# Patient Record
Sex: Female | Born: 1980 | Race: White | Hispanic: Yes | Marital: Single | Smoking: Never smoker
Health system: Southern US, Community
[De-identification: ages and names within clinical notes are randomized; demographics above are authoritative.]

## PROBLEM LIST (undated history)

## (undated) DIAGNOSIS — F419 Anxiety disorder, unspecified: Secondary | ICD-10-CM

---

## 2002-12-26 ENCOUNTER — Other Ambulatory Visit: Admission: RE | Admit: 2002-12-26 | Discharge: 2002-12-26 | Payer: Self-pay | Admitting: Obstetrics and Gynecology

## 2003-04-19 ENCOUNTER — Inpatient Hospital Stay (HOSPITAL_COMMUNITY): Admission: AD | Admit: 2003-04-19 | Discharge: 2003-04-19 | Payer: Self-pay | Admitting: Obstetrics and Gynecology

## 2003-04-20 ENCOUNTER — Inpatient Hospital Stay (HOSPITAL_COMMUNITY): Admission: AD | Admit: 2003-04-20 | Discharge: 2003-04-20 | Payer: Self-pay | Admitting: *Deleted

## 2003-04-26 ENCOUNTER — Inpatient Hospital Stay (HOSPITAL_COMMUNITY): Admission: AD | Admit: 2003-04-26 | Discharge: 2003-04-28 | Payer: Self-pay | Admitting: Obstetrics and Gynecology

## 2003-12-29 ENCOUNTER — Ambulatory Visit (HOSPITAL_COMMUNITY): Admission: RE | Admit: 2003-12-29 | Discharge: 2003-12-29 | Payer: Self-pay | Admitting: *Deleted

## 2004-01-25 ENCOUNTER — Inpatient Hospital Stay (HOSPITAL_COMMUNITY): Admission: AD | Admit: 2004-01-25 | Discharge: 2004-01-25 | Payer: Self-pay | Admitting: *Deleted

## 2004-01-27 ENCOUNTER — Inpatient Hospital Stay (HOSPITAL_COMMUNITY): Admission: AD | Admit: 2004-01-27 | Discharge: 2004-01-27 | Payer: Self-pay | Admitting: Obstetrics and Gynecology

## 2004-01-29 ENCOUNTER — Ambulatory Visit: Payer: Self-pay | Admitting: Family Medicine

## 2004-02-01 ENCOUNTER — Encounter: Admission: RE | Admit: 2004-02-01 | Discharge: 2004-02-01 | Payer: Self-pay | Admitting: Family Medicine

## 2004-02-01 ENCOUNTER — Inpatient Hospital Stay (HOSPITAL_COMMUNITY): Admission: AD | Admit: 2004-02-01 | Discharge: 2004-02-03 | Payer: Self-pay | Admitting: *Deleted

## 2004-02-08 ENCOUNTER — Encounter: Admission: RE | Admit: 2004-02-08 | Discharge: 2004-02-08 | Payer: Self-pay | Admitting: Family Medicine

## 2004-02-22 ENCOUNTER — Encounter: Admission: RE | Admit: 2004-02-22 | Discharge: 2004-02-22 | Payer: Self-pay | Admitting: Family Medicine

## 2004-02-29 ENCOUNTER — Ambulatory Visit: Payer: Self-pay | Admitting: Family Medicine

## 2004-03-02 ENCOUNTER — Ambulatory Visit: Payer: Self-pay | Admitting: Obstetrics and Gynecology

## 2004-03-02 ENCOUNTER — Inpatient Hospital Stay (HOSPITAL_COMMUNITY): Admission: AD | Admit: 2004-03-02 | Discharge: 2004-03-05 | Payer: Self-pay | Admitting: *Deleted

## 2004-03-06 ENCOUNTER — Encounter: Admission: RE | Admit: 2004-03-06 | Discharge: 2004-04-05 | Payer: Self-pay | Admitting: *Deleted

## 2004-11-24 ENCOUNTER — Emergency Department (HOSPITAL_COMMUNITY): Admission: EM | Admit: 2004-11-24 | Discharge: 2004-11-24 | Payer: Self-pay | Admitting: Emergency Medicine

## 2005-04-25 ENCOUNTER — Ambulatory Visit: Payer: Self-pay | Admitting: Family Medicine

## 2005-05-02 ENCOUNTER — Ambulatory Visit: Payer: Self-pay | Admitting: Family Medicine

## 2005-05-09 ENCOUNTER — Ambulatory Visit (HOSPITAL_COMMUNITY): Admission: RE | Admit: 2005-05-09 | Discharge: 2005-05-09 | Payer: Self-pay | Admitting: Family Medicine

## 2005-05-30 ENCOUNTER — Ambulatory Visit: Payer: Self-pay | Admitting: Family Medicine

## 2005-06-13 ENCOUNTER — Ambulatory Visit: Payer: Self-pay | Admitting: Family Medicine

## 2005-06-20 ENCOUNTER — Ambulatory Visit: Payer: Self-pay | Admitting: Sports Medicine

## 2005-06-27 ENCOUNTER — Ambulatory Visit (HOSPITAL_COMMUNITY): Admission: RE | Admit: 2005-06-27 | Discharge: 2005-06-27 | Payer: Self-pay | Admitting: Family Medicine

## 2005-07-24 ENCOUNTER — Inpatient Hospital Stay (HOSPITAL_COMMUNITY): Admission: AD | Admit: 2005-07-24 | Discharge: 2005-07-24 | Payer: Self-pay | Admitting: Obstetrics & Gynecology

## 2005-07-24 ENCOUNTER — Ambulatory Visit: Payer: Self-pay | Admitting: Family Medicine

## 2005-07-24 ENCOUNTER — Ambulatory Visit: Payer: Self-pay | Admitting: Certified Nurse Midwife

## 2005-08-01 ENCOUNTER — Ambulatory Visit: Payer: Self-pay | Admitting: Family Medicine

## 2005-08-06 ENCOUNTER — Inpatient Hospital Stay (HOSPITAL_COMMUNITY): Admission: AD | Admit: 2005-08-06 | Discharge: 2005-08-08 | Payer: Self-pay | Admitting: Obstetrics and Gynecology

## 2005-08-06 ENCOUNTER — Ambulatory Visit: Payer: Self-pay | Admitting: *Deleted

## 2005-08-15 ENCOUNTER — Ambulatory Visit: Payer: Self-pay | Admitting: *Deleted

## 2005-08-15 ENCOUNTER — Inpatient Hospital Stay (HOSPITAL_COMMUNITY): Admission: AD | Admit: 2005-08-15 | Discharge: 2005-08-16 | Payer: Self-pay | Admitting: *Deleted

## 2005-08-15 ENCOUNTER — Ambulatory Visit: Payer: Self-pay | Admitting: Sports Medicine

## 2005-08-27 ENCOUNTER — Ambulatory Visit: Payer: Self-pay | Admitting: Obstetrics and Gynecology

## 2005-08-27 ENCOUNTER — Inpatient Hospital Stay (HOSPITAL_COMMUNITY): Admission: AD | Admit: 2005-08-27 | Discharge: 2005-08-27 | Payer: Self-pay | Admitting: Obstetrics and Gynecology

## 2005-09-01 ENCOUNTER — Ambulatory Visit: Payer: Self-pay | Admitting: Sports Medicine

## 2005-09-17 ENCOUNTER — Ambulatory Visit: Payer: Self-pay | Admitting: Sports Medicine

## 2005-09-23 ENCOUNTER — Inpatient Hospital Stay (HOSPITAL_COMMUNITY): Admission: AD | Admit: 2005-09-23 | Discharge: 2005-09-24 | Payer: Self-pay | Admitting: *Deleted

## 2005-09-23 ENCOUNTER — Ambulatory Visit: Payer: Self-pay | Admitting: Family Medicine

## 2005-09-23 ENCOUNTER — Ambulatory Visit: Payer: Self-pay | Admitting: *Deleted

## 2005-11-07 ENCOUNTER — Ambulatory Visit: Payer: Self-pay | Admitting: Family Medicine

## 2005-12-01 ENCOUNTER — Ambulatory Visit: Payer: Self-pay | Admitting: Family Medicine

## 2008-11-24 ENCOUNTER — Encounter: Payer: Self-pay | Admitting: Family Medicine

## 2008-11-24 ENCOUNTER — Ambulatory Visit: Payer: Self-pay | Admitting: Family Medicine

## 2008-11-24 LAB — CONVERTED CEMR LAB
ABO/RH(D): O POS
Antibody Screen: NEGATIVE
Basophils Absolute: 0 10*3/uL (ref 0.0–0.1)
Basophils Relative: 0 % (ref 0–1)
Eosinophils Absolute: 0.1 10*3/uL (ref 0.0–0.7)
Eosinophils Relative: 2 % (ref 0–5)
HCT: 32.8 % — ABNORMAL LOW (ref 36.0–46.0)
Hemoglobin: 10.5 g/dL — ABNORMAL LOW (ref 12.0–15.0)
Hepatitis B Surface Ag: NEGATIVE
Lymphocytes Relative: 27 % (ref 12–46)
Lymphs Abs: 2 10*3/uL (ref 0.7–4.0)
MCHC: 32 g/dL (ref 30.0–36.0)
MCV: 82.8 fL (ref 78.0–100.0)
Monocytes Absolute: 0.6 10*3/uL (ref 0.1–1.0)
Monocytes Relative: 8 % (ref 3–12)
Neutro Abs: 4.8 10*3/uL (ref 1.7–7.7)
Neutrophils Relative %: 63 % (ref 43–77)
Platelets: 254 10*3/uL (ref 150–400)
RBC: 3.96 M/uL (ref 3.87–5.11)
RDW: 17.2 % — ABNORMAL HIGH (ref 11.5–15.5)
Rh Type: POSITIVE
Rubella: >500 intl units/mL — ABNORMAL HIGH
Sickle Cell Screen: NEGATIVE
WBC: 7.6 10*3/uL (ref 4.0–10.5)

## 2008-12-01 ENCOUNTER — Encounter: Payer: Self-pay | Admitting: Family Medicine

## 2008-12-01 ENCOUNTER — Ambulatory Visit: Payer: Self-pay | Admitting: Family Medicine

## 2008-12-01 LAB — CONVERTED CEMR LAB
Chlamydia, DNA Probe: NEGATIVE
GC Probe Amp, Genital: NEGATIVE

## 2008-12-05 ENCOUNTER — Encounter: Payer: Self-pay | Admitting: *Deleted

## 2008-12-15 ENCOUNTER — Ambulatory Visit: Payer: Self-pay | Admitting: Family Medicine

## 2008-12-19 ENCOUNTER — Telehealth: Payer: Self-pay | Admitting: *Deleted

## 2008-12-21 ENCOUNTER — Ambulatory Visit: Payer: Self-pay | Admitting: Obstetrics & Gynecology

## 2008-12-22 ENCOUNTER — Encounter: Payer: Self-pay | Admitting: Family Medicine

## 2008-12-22 ENCOUNTER — Ambulatory Visit: Payer: Self-pay | Admitting: Family Medicine

## 2008-12-22 DIAGNOSIS — O99810 Abnormal glucose complicating pregnancy: Secondary | ICD-10-CM

## 2009-01-02 ENCOUNTER — Telehealth: Payer: Self-pay | Admitting: Family Medicine

## 2009-01-05 ENCOUNTER — Encounter: Payer: Self-pay | Admitting: Family Medicine

## 2009-01-05 ENCOUNTER — Ambulatory Visit (HOSPITAL_COMMUNITY): Admission: RE | Admit: 2009-01-05 | Discharge: 2009-01-05 | Payer: Self-pay | Admitting: Family Medicine

## 2009-01-17 ENCOUNTER — Ambulatory Visit: Payer: Self-pay | Admitting: Family Medicine

## 2009-01-23 ENCOUNTER — Ambulatory Visit: Payer: Self-pay | Admitting: Family Medicine

## 2009-01-30 ENCOUNTER — Ambulatory Visit: Payer: Self-pay | Admitting: Family Medicine

## 2009-02-06 ENCOUNTER — Ambulatory Visit: Payer: Self-pay | Admitting: Family Medicine

## 2009-02-13 ENCOUNTER — Ambulatory Visit: Payer: Self-pay | Admitting: Family Medicine

## 2009-02-16 ENCOUNTER — Ambulatory Visit: Payer: Self-pay | Admitting: Family Medicine

## 2009-02-20 ENCOUNTER — Ambulatory Visit: Payer: Self-pay | Admitting: Family Medicine

## 2009-02-27 ENCOUNTER — Ambulatory Visit: Payer: Self-pay | Admitting: Family Medicine

## 2009-02-28 ENCOUNTER — Encounter: Payer: Self-pay | Admitting: Family Medicine

## 2009-03-02 ENCOUNTER — Encounter: Payer: Self-pay | Admitting: Family Medicine

## 2009-03-02 ENCOUNTER — Ambulatory Visit: Payer: Self-pay | Admitting: Family Medicine

## 2009-03-02 LAB — CONVERTED CEMR LAB
HCT: 36.3 % (ref 36.0–46.0)
Hemoglobin: 11.4 g/dL — ABNORMAL LOW (ref 12.0–15.0)
MCHC: 31.4 g/dL (ref 30.0–36.0)
MCV: 89 fL (ref 78.0–100.0)
Platelets: 212 10*3/uL (ref 150–400)
RBC: 4.08 M/uL (ref 3.87–5.11)
RDW: 17.7 % — ABNORMAL HIGH (ref 11.5–15.5)
WBC: 8 10*3/uL (ref 4.0–10.5)

## 2009-03-07 ENCOUNTER — Ambulatory Visit: Payer: Self-pay | Admitting: Family Medicine

## 2009-03-15 ENCOUNTER — Ambulatory Visit: Payer: Self-pay | Admitting: Family Medicine

## 2009-03-22 ENCOUNTER — Ambulatory Visit: Payer: Self-pay | Admitting: Family Medicine

## 2009-03-22 DIAGNOSIS — R509 Fever, unspecified: Secondary | ICD-10-CM

## 2009-03-22 LAB — CONVERTED CEMR LAB
Bilirubin Urine: NEGATIVE
Epithelial cells, urine: >20 /lpf
Glucose, Urine, Semiquant: NEGATIVE
Ketones, urine, test strip: NEGATIVE
Nitrite: NEGATIVE
Protein, U semiquant: 30
Rapid Strep: NEGATIVE
Specific Gravity, Urine: 1.025
Urobilinogen, UA: 0.2
pH: 6

## 2009-03-30 ENCOUNTER — Encounter: Payer: Self-pay | Admitting: Family Medicine

## 2009-03-30 ENCOUNTER — Ambulatory Visit: Payer: Self-pay | Admitting: Family Medicine

## 2009-03-30 DIAGNOSIS — R3915 Urgency of urination: Secondary | ICD-10-CM | POA: Insufficient documentation

## 2009-03-30 DIAGNOSIS — N898 Other specified noninflammatory disorders of vagina: Secondary | ICD-10-CM | POA: Insufficient documentation

## 2009-03-30 LAB — CONVERTED CEMR LAB
Bilirubin Urine: NEGATIVE
Glucose, Urine, Semiquant: NEGATIVE
Ketones, urine, test strip: NEGATIVE
Nitrite: NEGATIVE
Protein, U semiquant: 30
Specific Gravity, Urine: 1.025
Urobilinogen, UA: 0.2
Whiff Test: NEGATIVE
pH: 6.5

## 2009-04-06 ENCOUNTER — Encounter: Payer: Self-pay | Admitting: Family Medicine

## 2009-04-06 ENCOUNTER — Ambulatory Visit: Payer: Self-pay | Admitting: Family Medicine

## 2009-04-06 LAB — CONVERTED CEMR LAB
Bilirubin Urine: NEGATIVE
Blood in Urine, dipstick: NEGATIVE
Epithelial cells, urine: >20 /lpf
Glucose, Urine, Semiquant: NEGATIVE
Ketones, urine, test strip: NEGATIVE
Nitrite: NEGATIVE
Protein, U semiquant: 30
Specific Gravity, Urine: 1.025
Urobilinogen, UA: 0.2
pH: 6

## 2009-04-07 ENCOUNTER — Encounter: Payer: Self-pay | Admitting: Family Medicine

## 2009-04-09 ENCOUNTER — Ambulatory Visit: Payer: Self-pay | Admitting: Family Medicine

## 2009-04-13 ENCOUNTER — Ambulatory Visit: Payer: Self-pay | Admitting: Family Medicine

## 2009-04-20 ENCOUNTER — Ambulatory Visit: Payer: Self-pay | Admitting: Family Medicine

## 2009-04-26 ENCOUNTER — Ambulatory Visit: Payer: Self-pay | Admitting: Family Medicine

## 2009-04-27 ENCOUNTER — Ambulatory Visit: Payer: Self-pay | Admitting: Family Medicine

## 2009-05-03 ENCOUNTER — Encounter: Payer: Self-pay | Admitting: Family Medicine

## 2009-05-03 ENCOUNTER — Ambulatory Visit: Payer: Self-pay | Admitting: Family Medicine

## 2009-05-03 LAB — CONVERTED CEMR LAB
Chlamydia, DNA Probe: NEGATIVE
GC Probe Amp, Genital: NEGATIVE

## 2009-05-04 ENCOUNTER — Encounter: Payer: Self-pay | Admitting: Family Medicine

## 2009-05-07 ENCOUNTER — Inpatient Hospital Stay (HOSPITAL_COMMUNITY): Admission: AD | Admit: 2009-05-07 | Discharge: 2009-05-07 | Payer: Self-pay | Admitting: Obstetrics & Gynecology

## 2009-05-07 ENCOUNTER — Ambulatory Visit: Payer: Self-pay | Admitting: Family Medicine

## 2009-05-07 ENCOUNTER — Encounter: Payer: Self-pay | Admitting: Family Medicine

## 2009-05-17 ENCOUNTER — Ambulatory Visit: Payer: Self-pay | Admitting: Family Medicine

## 2009-05-19 ENCOUNTER — Inpatient Hospital Stay (HOSPITAL_COMMUNITY): Admission: AD | Admit: 2009-05-19 | Discharge: 2009-05-21 | Payer: Self-pay | Admitting: Obstetrics & Gynecology

## 2009-05-19 ENCOUNTER — Ambulatory Visit: Payer: Self-pay | Admitting: Advanced Practice Midwife

## 2009-07-06 ENCOUNTER — Ambulatory Visit: Payer: Self-pay | Admitting: Family Medicine

## 2010-06-15 ENCOUNTER — Emergency Department (HOSPITAL_COMMUNITY)
Admission: EM | Admit: 2010-06-15 | Discharge: 2010-06-15 | Payer: Self-pay | Source: Home / Self Care | Admitting: Emergency Medicine

## 2010-07-30 NOTE — Assessment & Plan Note (Signed)
Summary: postpartum 6 wk visit.    Vital Signs:  Patient profile:   30 year old female Weight:      132.9 pounds Temp:     98.3 degrees F oral Pulse rate:   49 / minute BP sitting:   111 / 68  (left arm) Cuff size:   regular  Vitals Entered By: Gladstone Pih (July 06, 2009 11:49 AM) CC: pp Is Patient Diabetic? No Pain Assessment Patient in pain? no        Primary Care Provider:  Jamie Brookes MD  CC:  pp.  History of Present Illness: Pregnancy complications: none Delivery type: vaginal delicery Vaginal bleeding:still come spotting  Menses: yes Contraception: starting Sprintec Vaginal discharge: yes, some blood Abdominal pain: no Fever/Chills: no Feeding: bottle  Sleep:baby wakes up every hour Mood: happy Return to school/work: stays at home w/ baby Bowel Movements: regular and normal  had  chils 2 days ago. Passing small dark clots.   Habits & Providers  Alcohol-Tobacco-Diet     Tobacco Status: never  Current Medications (verified): 1)  Sprintec 28 0.25-35 Mg-Mcg Tabs (Norgestimate-Eth Estradiol) .... Take One Pill Daily For 21 Days, Then Off For 1 Week To Have A Period.  Please Give 3 Packs At A Time.  Allergies (verified): No Known Drug Allergies  Review of Systems        vitals reviewed and pertinent negatives and positives seen in HPI   Physical Exam  General:  Well-developed,well-nourished,in no acute distress; alert,appropriate and cooperative throughout examination Abdomen:  Bowel sounds positive,abdomen soft and non-tender without masses, organomegaly or hernias noted. Genitalia:  Normal introitus for age, no external lesions, no vaginal discharge, mucosa pink and moist, no vaginal or cervical lesions, no vaginal atrophy, no friaility, normal uterus size and position, no adnexal masses or tenderness, minimal bloody discharge at os.  Psych:  Cognition and judgment appear intact. Alert and cooperative with normal attention span and  concentration. No apparent delusions, illusions, hallucinations   Impression & Recommendations:  Problem # 1:  ROUTINE POSTPARTUM FOLLOW-UP (ICD-V24.2) Assessment Unchanged Pt is doing well. She is not sleeping a ton, but is happy with her life and circumstances. Discussed contraception. Pt wants to get Implanon but does not have the money to pay for it. Recommended the [facility] Dept. Gave her the number.   Orders: [facility]- Est Level  3 (16109)  Complete Medication List: 1)  Sprintec 28 0.25-35 Mg-mcg Tabs (Norgestimate-eth estradiol) .... Take one pill daily for 21 days, then off for 1 week to have a period.  please give 3 packs at a time.  Patient Instructions: 1)  Pick up your Sprintec  2)  Call the [facility] Department at [phone] about the implanon.  Prescriptions: SPRINTEC 28 0.25-35 MG-MCG TABS (NORGESTIMATE-ETH ESTRADIOL) take one pill daily for 21 days, then off for 1 week to have a period.  Please give 3 packs at a time.  #3 x 3   Entered and Authorized by:   Jamie Brookes MD   Signed by:   Jamie Brookes MD on 07/06/2009   Method used:   Electronically to        [facility] [address] (retail)       [id number]       [place], [place]  [address]       Ph: [phone]       Fax: [phone]   RxID:   [id number]

## 2010-10-02 LAB — CBC
HCT: 39 % (ref 36.0–46.0)
Hemoglobin: 13 g/dL (ref 12.0–15.0)
MCHC: 33.4 g/dL (ref 30.0–36.0)
MCV: 88.4 fL (ref 78.0–100.0)
Platelets: 188 10*3/uL (ref 150–400)
RBC: 4.41 MIL/uL (ref 3.87–5.11)
RDW: 17 % — ABNORMAL HIGH (ref 11.5–15.5)
WBC: 8.9 10*3/uL (ref 4.0–10.5)

## 2010-10-02 LAB — RPR: RPR Ser Ql: NONREACTIVE

## 2010-10-04 LAB — GLUCOSE, CAPILLARY: Glucose-Capillary: 126 mg/dL — ABNORMAL HIGH (ref 70–99)

## 2010-10-07 LAB — POCT URINALYSIS DIP (DEVICE)
Bilirubin Urine: NEGATIVE
Glucose, UA: NEGATIVE mg/dL
Ketones, ur: NEGATIVE mg/dL
Nitrite: NEGATIVE
Protein, ur: 30 mg/dL — AB
Specific Gravity, Urine: >=1.03 (ref 1.005–1.030)
Urobilinogen, UA: 0.2 mg/dL (ref 0.0–1.0)
pH: 6 (ref 5.0–8.0)

## 2010-10-07 LAB — GLUCOSE, CAPILLARY
Glucose-Capillary: 147 mg/dL — ABNORMAL HIGH (ref 70–99)
Glucose-Capillary: 80 mg/dL (ref 70–99)

## 2010-11-15 NOTE — Discharge Summary (Signed)
Carol [name], Carol [name]     ACCOUNT NO.:  [id number]   MEDICAL RECORD NO.:  [id number]          PATIENT TYPE:  INP   LOCATION:  9159                          FACILITY:  [facility]   PHYSICIAN:  Angie B. Merlene Morse, MD  DATE OF BIRTH:  August 01, 1980   DATE OF ADMISSION:  08/06/2005  DATE OF DISCHARGE:  08/08/2005                                 DISCHARGE SUMMARY   ADMITTING DIAGNOSES:  1.  Preterm contractions.  2.  Preterm cervical change.  3.  A 32 and 5/7 weeks intrauterine pregnancy.   DISCHARGE DIAGNOSES:  1.  Preterm contractions.  2.  Preterm cervical change.  3.  A 32 and 5/7 weeks intrauterine pregnancy.   ADMITTING HISTORY AND PHYSICAL:  Please see the history and physical in the  chart for more information, but briefly, the patient is a 30 year old G3, P1-  1-0-2, who presented at 30 and 5/7 weeks complaining of pain and  contractions every 5 minutes.  She states that she had 1 child at 39 weeks  and 1 child at 36 weeks.   PHYSICAL EXAMINATION:  Physical exam was unremarkable except for on  admission, her cervix changed from 1 cm, 50% and -2 to 2 cm, 50% and -2.  The patient was admitted and she was started on IV fluids and Unasyn, and  Procardia.  She was kept on bedrest.  She did receive betamethasone x2.  The  patient remained stable.  Her contractions subsided.   HOSPITAL COURSE:  On the day of discharge, her cervix was found to be  fingertip, 3cm long and high.   SIGNIFICANT LABORATORY:  Fetal fibronectin negative, GBS negative, GC and  Chlamydia negative, wet prep negative, UA negative, ultrasound showed normal  AFI and a cervical length of 3 and 4/8 cm, and estimated fetal weight of 50  to 75th percentile.   DISCHARGE INSTRUCTIONS:  The patient was discharged to home with regular  diet.  Her activity was modified bedrest.   MEDICATIONS:  Procardia 30 mg XL 1 p.o. b.i.d. for 2 weeks.   The patient is to follow up at [facility] as scheduled  next  week.           ______________________________  August Saucer. Merlene Morse, MD     ABC/MEDQ  D:  08/08/2005  T:  08/09/2005  Job:  [id number]

## 2010-11-15 NOTE — Discharge Summary (Signed)
Carol [name], [name]     ACCOUNT NO.:  [id number]   MEDICAL RECORD NO.:  [id number]          PATIENT TYPE:  INP   LOCATION:  9153                          [facility]   PHYSICIAN:  Tracy L. Mayford Knife, M.D.DATE OF BIRTH:  10/10/1980   DATE OF ADMISSION:  08/15/2005  DATE OF DISCHARGE:  08/16/2005                                 DISCHARGE SUMMARY   ADMISSION DIAGNOSES:  1.  Preterm contractions.  2.  Preterm cervical change.  3.  Three to four week intrauterine pregnancy.   DISCHARGE DIAGNOSES:  1.  Preterm contractions.  2.  Preterm cervical change.  3.  Three to four week intrauterine pregnancy.   HISTORY OF PRESENT ILLNESS:  The patient is a 30 year old gravida 3, para 1-  1-0-2 admitted at [redacted] weeks gestation.  She was hospitalization from February  7, to February 9 with preterm cervical change.  During that hospitalization,  she had been 1 cm dilated, 50%, -2 station, and progressed to 2 cm. However,  after being on IV fluid __________  and receiving her two doses of  betamethasone, her sterile vaginal exam improved to fingertip and long and  high.  Her ultrasound at that time showed that her cervix was 2.8 cm long.  On the day of admission, the patient was seen by her primary care physician  and was felt to be dilated 3-4 cm.  Because of her cervical change, she was  admitted for observation.   HOSPITAL COURSE:  She was kept on bed rest, given IV fluids, Procardia and  she received __________ .  Please note that the last hospitalization her  gonorrhea, Chlamydia and GBS were all negative.  Just prior to discharge,  the patient denied feeling contractions.  She had had 0 to 2 contractions an  hour. Once again her sterile vaginal exam was improved to 2 cm, 50 and -2  and it was felt that she was stable for discharge.   DISPOSITION:  Home in stable condition.   FOLLOW UP:  With Dr. Dellis Anes in approximately 1-1/2 weeks.   DISCHARGE INSTRUCTIONS:  She was  counseled on signs and symptoms of preterm  labor.   ACTIVITY:  Strict bed rest and nothing per vagina. Specifically no  intercourse.   MEDICATIONS:  Procardia XL 30 mg one p.o. b.i.d.           ______________________________  Marc Morgans. Mayford Knife, M.D.    TLW/MEDQ  D:  08/16/2005  T:  08/17/2005  Job:  [id number]

## 2010-11-15 NOTE — Discharge Summary (Signed)
NAMEMACIE, BAUM                 ACCOUNT NO.:  [id number]   MEDICAL RECORD NO.:  [id number]                   PATIENT TYPE:  INP   LOCATION:  9158                                 [facility]ILITY[facility]   PHYSICIAN:  Conni Elliot, M.D.             DATE OF BIRTH:  April 05, 1981   DATE OF ADMISSION:  02/01/2004  DATE OF DISCHARGE:  02/03/2004                                 DISCHARGE SUMMARY   ADMISSION DIAGNOSES:  44. A 30 year old G2, P1-0-0-1, at 32-6/7 weeks, with uterine contractions.  2. Group B Streptococcus negative.   DISCHARGE DIAGNOSES:  86. A 30 year old G2, P1, at 33-1/7 weeks with resolved threatened preterm     labor.  2. Reassuring fetal heart tracing.   DISCHARGE MEDICATIONS:  1. Procardia XL 30 mg one p.o. b.i.d.  2. Prenatal vitamin one p.o. daily.   ADMISSION HISTORY:  Ms. Barrell presented to high-risk clinic at 67-  6/7 weeks with increased contractions.  She was noted to have a dynamic  cervix on exam and was admitted for preterm labor.   HOSPITAL COURSE:  Problem 1.  PRETERM LABOR:  Ms. Biegler continued to contract the day  of her admission to labor and delivery.  She was placed on magnesium  sulfate, Unasyn, and given betamethasone x2.  Her contractions slowed but  did not resolve on magnesium.  This was continued for 48 hours, at which  time she was titrated to Procardia.  Her contractions dramatically slowed  after being started on Procardia.  On the evening of discharge she was  contracting no more than one every 10-20 minutes with a very irregular  pattern.  Her cervix on discharge was noted to be fingertip, about 50%, and  -3.  This is improved from an exam of 1 cm, 50%, and -3.   Throughout the hospitalization the fetal heart tracing was reassuring.   DISPOSITION ON DISCHARGE:  Ms. Swader was discharged to home in  stable condition.   INSTRUCTIONS GIVEN TO THE PATIENT:  The patient was told of the above  medical  regimen.  She is to have strict bed-rest until term.  She is to  follow up in the high-risk clinic this coming week.     Jon Gills, M.D.                     Conni Elliot, M.D.    LC/MEDQ  D:  02/03/2004  T:  02/05/2004  Job:  [id number]

## 2013-11-27 ENCOUNTER — Emergency Department (HOSPITAL_COMMUNITY)
Admission: EM | Admit: 2013-11-27 | Discharge: 2013-11-27 | Disposition: A | Discharge status: Home / Self Care | Payer: Self-pay | Attending: Emergency Medicine | Admitting: Emergency Medicine

## 2013-11-27 ENCOUNTER — Emergency Department (HOSPITAL_COMMUNITY): Payer: Self-pay

## 2013-11-27 ENCOUNTER — Encounter (HOSPITAL_COMMUNITY): Payer: Self-pay | Admitting: Emergency Medicine

## 2013-11-27 DIAGNOSIS — F411 Generalized anxiety disorder: Secondary | ICD-10-CM | POA: Insufficient documentation

## 2013-11-27 DIAGNOSIS — E876 Hypokalemia: Secondary | ICD-10-CM

## 2013-11-27 DIAGNOSIS — Z3202 Encounter for pregnancy test, result negative: Secondary | ICD-10-CM | POA: Insufficient documentation

## 2013-11-27 DIAGNOSIS — Z7982 Long term (current) use of aspirin: Secondary | ICD-10-CM | POA: Insufficient documentation

## 2013-11-27 DIAGNOSIS — Z79899 Other long term (current) drug therapy: Secondary | ICD-10-CM | POA: Insufficient documentation

## 2013-11-27 DIAGNOSIS — R0609 Other forms of dyspnea: Secondary | ICD-10-CM | POA: Insufficient documentation

## 2013-11-27 DIAGNOSIS — F419 Anxiety disorder, unspecified: Secondary | ICD-10-CM

## 2013-11-27 DIAGNOSIS — R000 Tachycardia, unspecified: Secondary | ICD-10-CM | POA: Insufficient documentation

## 2013-11-27 DIAGNOSIS — R0989 Other specified symptoms and signs involving the circulatory and respiratory systems: Principal | ICD-10-CM | POA: Insufficient documentation

## 2013-11-27 DIAGNOSIS — Z880 Allergy status to penicillin: Secondary | ICD-10-CM | POA: Insufficient documentation

## 2013-11-27 DIAGNOSIS — R079 Chest pain, unspecified: Secondary | ICD-10-CM

## 2013-11-27 DIAGNOSIS — R0600 Dyspnea, unspecified: Secondary | ICD-10-CM

## 2013-11-27 DIAGNOSIS — M79609 Pain in unspecified limb: Secondary | ICD-10-CM | POA: Insufficient documentation

## 2013-11-27 DIAGNOSIS — R109 Unspecified abdominal pain: Secondary | ICD-10-CM | POA: Insufficient documentation

## 2013-11-27 HISTORY — DX: Anxiety disorder, unspecified: F41.9

## 2013-11-27 LAB — URINALYSIS, ROUTINE W REFLEX MICROSCOPIC
Bilirubin Urine: NEGATIVE
Glucose, UA: NEGATIVE mg/dL
Hgb urine dipstick: NEGATIVE
Ketones, ur: NEGATIVE mg/dL
Nitrite: NEGATIVE
Protein, ur: NEGATIVE mg/dL
Specific Gravity, Urine: 1.021 (ref 1.005–1.030)
Urobilinogen, UA: 1 mg/dL (ref 0.0–1.0)
pH: 7 (ref 5.0–8.0)

## 2013-11-27 LAB — I-STAT CHEM 8, ED
BUN: 14 mg/dL (ref 6–23)
CALCIUM ION: 1.24 mmol/L — AB (ref 1.12–1.23)
Chloride: 106 mEq/L (ref 96–112)
Creatinine, Ser: 0.6 mg/dL (ref 0.50–1.10)
Glucose, Bld: 96 mg/dL (ref 70–99)
HEMATOCRIT: 36 % (ref 36.0–46.0)
HEMOGLOBIN: 12.2 g/dL (ref 12.0–15.0)
POTASSIUM: 3.1 meq/L — AB (ref 3.7–5.3)
Sodium: 141 mEq/L (ref 137–147)
TCO2: 24 mmol/L (ref 0–100)

## 2013-11-27 LAB — URINE MICROSCOPIC-ADD ON

## 2013-11-27 LAB — POC URINE PREG, ED: Preg Test, Ur: NEGATIVE

## 2013-11-27 LAB — D-DIMER, QUANTITATIVE (NOT AT ARMC): D-Dimer, Quant: 0.66 ug/mL-FEU — ABNORMAL HIGH (ref 0.00–0.48)

## 2013-11-27 MED ORDER — LORAZEPAM 1 MG PO TABS
1.0000 mg | ORAL_TABLET | Freq: Once | ORAL | Status: AC
  Administered 2013-11-27: 1 mg via ORAL
  Filled 2013-11-27: qty 1

## 2013-11-27 MED ORDER — LORAZEPAM 1 MG PO TABS: 1.0000 mg | ORAL_TABLET | Freq: Three times a day (TID) | ORAL | Status: AC | PRN

## 2013-11-27 MED ORDER — IOHEXOL 350 MG/ML SOLN: 80.0000 mL | Freq: Once | INTRAVENOUS | Status: DC | PRN

## 2013-11-27 MED ORDER — POTASSIUM CHLORIDE CRYS ER 20 MEQ PO TBCR
40.0000 meq | EXTENDED_RELEASE_TABLET | Freq: Once | ORAL | Status: AC
  Administered 2013-11-27: 40 meq via ORAL
  Filled 2013-11-27: qty 2

## 2013-11-27 MED ORDER — OXYCODONE-ACETAMINOPHEN 5-325 MG PO TABS
1.0000 | ORAL_TABLET | Freq: Once | ORAL | Status: AC
  Administered 2013-11-27: 1 via ORAL
  Filled 2013-11-27: qty 1

## 2013-11-27 NOTE — ED Notes (Signed)
Pt to ED with c/o shortness of breath and pain to right arm and right knee.  Pt hyperventilating and left hand cramping.  Pt was recently started on Zoloft st's not helping

## 2013-11-27 NOTE — ED Provider Notes (Signed)
CSN: [id number]     Arrival date & time 11/27/13  1636 History   First MD Initiated Contact with Patient 11/27/13 1720     Chief Complaint  Patient presents with  . Shortness of Breath      Patient is a 33 y.o. female presenting with shortness of breath. The history is provided by the patient.  Shortness of Breath Severity:  Moderate Onset quality:  Gradual Duration:  1 day Timing:  Constant Progression:  Worsening Chronicity:  Recurrent Relieved by:  Nothing Worsened by:  Nothing tried Associated symptoms: abdominal pain and chest pain   Associated symptoms: no fever   pt reports she has had SOB for past day.  She feels anxious She reports chest pain and palpitations No syncope She also reports mild lower abdominal pain No fever/vomiting She also reports for past month she has had left arm pain and left LE pain No falls/trauma  She reports h/o anxiety, has been taking zoloft for one month without improvement This episode is similar to prior episodes of anxiety  She denies h/o DVT/PE She tells me she is not on OCPs    Past Medical History  Diagnosis Date  . Anxiety    History reviewed. No pertinent past surgical history. No family history on file. History  Substance Use Topics  . Smoking status: Never Smoker   . Smokeless tobacco: Not on file  . Alcohol Use: No   OB History   Grav Para Term Preterm Abortions TAB SAB Ect Mult Living                 Review of Systems  Constitutional: Negative for fever.  Respiratory: Positive for shortness of breath.   Cardiovascular: Positive for chest pain.  Gastrointestinal: Positive for abdominal pain.  Neurological: Negative for syncope.  Psychiatric/Behavioral: The patient is nervous/anxious.   All other systems reviewed and are negative.     Allergies  Advil and Penicillins  Home Medications   Prior to Admission medications   Medication Sig Start Date End Date Taking? Authorizing Provider  aspirin EC 325 MG  tablet Take 650 mg by mouth 3 (three) times daily as needed (pain).   Yes Historical Provider, MD  sertraline (ZOLOFT) 50 MG tablet Take 50 mg by mouth daily.   Yes Historical Provider, MD   BP 105/77  Pulse 59  Temp(Src) 97.9 F (36.6 C) (Oral)  Resp 28  SpO2 100%  LMP 11/06/2013 Physical Exam CONSTITUTIONAL: Well developed/well nourished, anxious HEAD: Normocephalic/atraumatic EYES: EOMI/PERRL ENMT: Mucous membranes moist NECK: supple no meningeal signs SPINE:entire spine nontender CV: S1/S2 noted, no murmurs/rubs/gallops noted LUNGS: Lungs are clear to auscultation bilaterally, no apparent distress ABDOMEN: soft, nontender, no rebound or guarding GU:no cva tenderness NEURO: Pt is awake/alert, moves all extremitiesx4. She is able to ambulate. EXTREMITIES: pulses normal, full ROM, no edema or erythema to left UE/left LE.  No bruising noted.  No signs of trauma.   SKIN: warm, color normal PSYCH: anxious  ED Course  Procedures  5:57 PM Pt with chest pain/palpitations/SOB and reported arm/leg pain but no signs of trauma or acute DVT D-dimer/imaging ordered 7:51 PM ddimer positive Will order CT chest She reports left arm and left leg pain but no signs of DVT and pain is mostly in left knee/foot  However due to CP/SOB and +ddimer, CT chest ordered She reports anxiety improved Denies SI and feels safe at home but admits to previous thoughts of SI in past but never taken action Will  give outpatient referrals but currently she is not suicidal 9:24 PM CT chest negative Pt well appearing, no distress Stable for d/c home Short course of ativan given for anxiety. Advised PCP followup for re-evaluation Resource guide given  Labs Review Labs Reviewed  URINALYSIS, ROUTINE W REFLEX MICROSCOPIC - Abnormal; Notable for the following:    APPearance CLOUDY (*)    Leukocytes, UA SMALL (*)    All other components within normal limits  D-DIMER, QUANTITATIVE - Abnormal; Notable for the  following:    D-Dimer, Quant 0.66 (*)    All other components within normal limits  URINE MICROSCOPIC-ADD ON - Abnormal; Notable for the following:    Squamous Epithelial / LPF FEW (*)    Bacteria, UA MANY (*)    All other components within normal limits  I-STAT CHEM 8, ED - Abnormal; Notable for the following:    Potassium 3.1 (*)    Calcium, Ion 1.24 (*)    All other components within normal limits  POC URINE PREG, ED    Imaging Review Dg Chest 2 View  11/27/2013   CLINICAL DATA:  Short of breath. Four years history of chronic shortness of breath.  EXAM: CHEST  2 VIEW  COMPARISON:  None.  FINDINGS: Cardiopericardial silhouette within normal limits. Mediastinal contours normal. Trachea midline. No airspace disease or effusion.  IMPRESSION: No active cardiopulmonary disease.   Electronically Signed   By: Andreas Newport M.D.   On: 11/27/2013 18:59     EKG Interpretation   Date/Time:  Sunday Nov 27 2013 17:47:12 EDT Ventricular Rate:  60 PR Interval:  160 QRS Duration: 104 QT Interval:  426 QTC Calculation: 426 R Axis:   41 Text Interpretation:  Sinus rhythm RSR' in V1 or V2, right VCD or RVH No  previous ECGs available artifact noted Confirmed by Bebe Shaggy  MD, Pradeep Beaubrun  [phone]) on 11/27/2013 5:56:51 PM      MDM   Final diagnoses:  Anxiety  Chest pain  Dyspnea  Hypokalemia   Nursing notes including past medical history and social history reviewed and considered in documentation xrays reviewed and considered Labs/vital reviewed and considered     Joya Gaskins, MD 11/27/13 2125

## 2013-11-27 NOTE — ED Notes (Signed)
Patient transported to CT 

## 2013-11-27 NOTE — Discharge Instructions (Signed)
Your caregiver has diagnosed you as having chest pain that is not specific for one problem, but does not require admission.  Chest pain comes from many different causes.  °SEEK IMMEDIATE MEDICAL ATTENTION IF: °You have severe chest pain, especially if the pain is crushing or pressure-like and spreads to the arms, back, neck, or jaw, or if you have sweating, nausea (feeling sick to your stomach), or shortness of breath. THIS IS AN EMERGENCY. Don't wait to see if the pain will go away. Get medical help at once. Call 911 or 0 (operator). DO NOT drive yourself to the hospital.  °Your chest pain gets worse and does not go away with rest.  °You have an attack of chest pain lasting longer than usual, despite rest and treatment with the medications your caregiver has prescribed.  °You wake from sleep with chest pain or shortness of breath.  °You feel dizzy or faint.  °You have chest pain not typical of your usual pain for which you originally saw your caregiver. ° ° °Emergency Department Resource Guide °1) Find a Doctor and Pay Out of Pocket °Although you won't have to find out who is covered by your insurance plan, it is a good idea to ask around and get recommendations. You will then need to call the office and see if the doctor you have chosen will accept you as a new patient and what types of options they offer for patients who are self-pay. Some doctors offer discounts or will set up payment plans for their patients who do not have insurance, but you will need to ask so you aren't surprised when you get to your appointment. ° °2) Contact Your Local Health Department °Not all health departments have doctors that can see patients for sick visits, but many do, so it is worth a call to see if yours does. If you don't know where your local health department is, you can check in your phone book. The CDC also has a tool to help you locate your state's health department, and many state websites also have listings of all of  their local health departments. ° °3) Find a Walk-in Clinic °If your illness is not likely to be very severe or complicated, you may want to try a walk in clinic. These are popping up all over the country in pharmacies, drugstores, and shopping centers. They're usually staffed by nurse practitioners or physician assistants that have been trained to treat common illnesses and complaints. They're usually fairly quick and inexpensive. However, if you have serious medical issues or chronic medical problems, these are probably not your best option. ° °No Primary Care Doctor: °- Call [facility] at  832-8000 - they can help you locate a primary care doctor that  accepts your insurance, provides certain services, etc. °- Physician Referral Service- 1-800-533-3463 ° °Chronic Pain Problems: °Organization         Address  Phone   Notes  °[facility]  (336) 297-2271 Patients need to be referred by their primary care doctor.  ° °Medication Assistance: °Organization         Address  Phone   Notes  °[place] [facility] [address] °[place], [place] [address] (336) 641-8030 --Must be a resident of [place] °-- Must have NO insurance coverage whatsoever (no Medicaid/ Medicare, etc.) °-- The pt. MUST have a primary care doctor that directs their care regularly and follows them in the community °  °[facility]  (  866) 331-1348   °[employer]  (888) 892-1162   ° °Agencies that provide inexpensive medical care: °Organization         Address  Phone   Notes  °[facility]  (336) 832-8035   °[facility]    (336) 832-7272   °[facility] [address] °[place], [place] [address] (336) 832-4777   °[facility] [address], °[place] (336) 271-4999   °[facility]    (336) 373-0678   °[facility]    (336) 272-1050   °[facility] ° [address],  [place] Phone:  (336) 832-4444, Fax:  (336) 832-4440 Hours of Operation:  9 am - 6 pm, M-F.  Also accepts Medicaid/Medicare and self-pay.  °[facility] ° [address], [place] Phone: (336) 832-3150, Fax: (336) 832-3151. Hours of Operation:  8:30 am - 5:30 pm, M-F.  Also accepts Medicaid and self-pay.  °[facility] [place] [address], [place] Phone: (336) 878-6027   °[facility] [address], [place], [place] (336)723-1848, Ext. 123 Mondays & Thursdays: 7-9 AM.  First 15 patients are seen on a first come, first serve basis. °  ° °Medicaid-accepting [place] Providers: ° °Organization         Address  Phone   Notes  °[facility] [address], [place] (336) 641-2100 Also accepts self-pay patients.  °[facility] [address], [place] ° (336) 856-9996   °[facility] [address], [place] (336) 288-8857   °[facility] [address] [place] [address], [place] (336) 299-7000   °Veita Bland [address], [place]  ° (336) 373-1557 Only accepts [place] Access Medicaid patients after they have their name applied to their card.  ° °Self-Pay (no insurance) in [place]: ° °Organization         Address  Phone   Notes  °Sickle Cell Patients, [facility] [address], [place] (336) 832-1970   °[facility] [address], [place] (336) 832-4400   ° [facility] ° [address], [place] (336) 992-4800   °[facility]/Dr. Osei-Bonsu ° [address] [place] [address], [place] or [address], [place] (336) 841-8500 Phone number for both [place] and [place] locations is the same.  °[facility] [address], [place] (336) 299-0000   °[facility] [address] [place] [address], [place] or [address] (336) 852-7530 °(336) 878-2260   °[facility] [address], [place] (336) 350-1642, phone; (336) 294-5005, fax Sees patients 1st and 3rd Saturday of every month.  Must not qualify for public or private insurance (i.e. Medicaid, Medicare, [place] Health Choice, Veterans' Benefits) • Household income should be no more than 200% of the poverty level •The clinic cannot treat you if you are pregnant or think you are pregnant • Sexually transmitted diseases are not treated at the clinic.  ° ° °Dental Care: °Organization         Address  Phone  Notes  °[facility] [address],  (336) 641-6152 Accepts children up to age 21 who are enrolled in Medicaid or [place] Health Choice; pregnant women   with a Medicaid card; and children who have applied for Medicaid or [place] Health Choice, but were declined, whose parents can pay a reduced fee at time of service.  °[facility] [place]  [address], [place] (336) 641-7733 Accepts children up to age 21 who are enrolled in Medicaid or [place] Health Choice; pregnant women with a Medicaid card; and children who have applied for Medicaid or [place] Health Choice, but were declined, whose parents can pay a reduced fee at time of service.  °[facility] ° [address], [place] (336) 641-4533 Patients are seen by appointment only. Walk-ins are not accepted. [facility] will see patients 18 years of age and older. °Monday - Tuesday (8am-5pm) °Most Wednesdays (8:30-5pm) °$30 per visit, cash only  °[facility] ° [address], [place] (336) 641-4533 Patients are seen by appointment only. Walk-ins are not accepted. [facility] will see patients 18 years of age and older. °One Wednesday Evening (Monthly: Volunteer Based).  $30 per visit, cash only  °[facility]   (919) 537-3737 for adults; Children under age 4, call Graduate Pediatric Dentistry at ([phone]) [phone]. Children aged 4-14, please call ([phone]) [phone] to request a pediatric application. ° Dental services are provided in all areas of dental care including fillings, crowns and bridges, complete and partial dentures, implants, gum treatment, root canals, and extractions. Preventive care is also provided. Treatment is provided to both adults and children. °Patients are selected via a lottery and there is often a waiting list. °  °[facility] [address], °[place] ° (336) 763-8833 [email or web address] °  °[facility] [address], [place], [place] (336)723-1848, Ext. 123 Second and Fourth Thursday of each month, opens at 6:30 AM; Clinic ends at 9 AM.  Patients are seen on a first-come first-served basis, and a limited number are seen during each clinic.  ° °[facility] ° [address], [place], [place] (336) 723-7904   Eligibility Requirements °You must have lived in [place], [place], or [place] counties for at least the last three months. °  You cannot be eligible for state or federal sponsored healthcare insurance, including Veterans Administration, Medicaid, or Medicare. °  You generally cannot be eligible for healthcare insurance through your employer.  °  How to apply: °Eligibility screenings are held every Tuesday and Wednesday afternoon from 1:00 pm until 4:00 pm. You do not need an appointment for the interview!  °[facility] [address], [place], [place] 336-631-2330   °[facility]  336-342-8273   °[facility]  336-703-3100   °[place] [facility]  336-570-6415   ° °Behavioral Health Resources in the Community: °Intensive Outpatient Programs °Organization         Address  Phone  Notes  °[facility] [address], [place], [place] 336-878-6098   °[facility] [address], [place], [place] 336-832-9800   °[facility] [address], [place], [place] ° 336-882-2125   °[facility] [address],  °[place], [place] 1-800-853-5163 or 336-641-4981   °Substance Abuse Resources °Organization         Address  Phone  Notes  °[facility]  336-882-2125   °[facility]  336-784-9470   °[facility]  336-285-9073   °[facility]    336-845-3988   °Residential & Outpatient Substance Abuse Program  1-800-659-3381   °Psychological Services °Organization         Address  Phone  Notes  °[facility]  336- 832-9600   °[facility]  336- 378-7881   °[facility] [address], [place] 1-800-853-5163 or 336-641-4981   ° °Mobile Crisis Teams °Organization         Address  Phone  Notes  °[facility], Mobile Crisis Care Unit  1-877-626-1772   °Assertive °[facility] ° [address] [place], [place] 336-834-9664   °Sharon DeEsch [address] °[place] 336-554-5454   ° °Self-Help/Support Groups °Organization         Address  Phone             Notes  °[facility] - variety of support groups  336- 373-1402 Call for more information  °Narcotics Anonymous (NA), [facility] [address], °[place]  2 meetings at this location  ° °Residential Treatment Programs °Organization         Address  Phone  Notes  °[facility] [address],    °[place]  1-866-801-8205   °[facility] ° [address], [place], [place] 704-293-8524   °[facility] [address], [place] 336-845-3988 Admissions: 8am-3pm M-F  °[facility] [address]    °[place], [place] 336-841-1104   °[facility] [address], [place], [place] 336-379-7146   °[facility] [address]  °[place], [place] 336-285-9073     °[facility] - Intensive Outpatient [address], [place], [place] 336-852-3033   °[facility] [address]  °[place], [place] 1-877-615-2722 or 336-784-9470   °[facility] [address] [place], [place] 336-227-7417 Accepts Medicaid  °[facility] [address]  °[place] 1-800-659-3381 Substance Abuse/Addiction Treatment  ° °[place] Behavioral Health Resources °Organization         Address  Phone  Notes  °[facility]  (888) 581-9988   °Julie Brannon, PhD [address] [place], [place]   (336) 349-5553 or (336) 951-0000   °[facility]   [address] °[place], [place] (336) 349-4454   °[facility] [address], [place], [place] (336) 342-8316 Insurance/Medicaid/sponsorship through [facility]  °[facility] [address]                                    [place], [place] (336) 342-8316 Therapy/tele-psych/case  °[facility] [address]  ° [place], [place] (336) 349-2233    °Dr. Arfeen  (336) 349-4544   °[facility] [place]  [employer] [place] [facility] 1) [address],  °2) [address], [place] °3)  [address], [place] (336) 349-3220 °(336) 342-7768 ° °(336) 342-8140   °[place] [facility] (336) 342-1394 or (336) 342-3537 (After Hours)    ° ° ° °

## 2014-08-03 ENCOUNTER — Emergency Department (HOSPITAL_COMMUNITY): Payer: No Typology Code available for payment source

## 2014-08-03 ENCOUNTER — Emergency Department (HOSPITAL_COMMUNITY)
Admission: EM | Admit: 2014-08-03 | Discharge: 2014-08-03 | Disposition: A | Discharge status: Home / Self Care | Payer: No Typology Code available for payment source | Attending: Emergency Medicine | Admitting: Emergency Medicine

## 2014-08-03 ENCOUNTER — Encounter (HOSPITAL_COMMUNITY): Payer: Self-pay | Admitting: Emergency Medicine

## 2014-08-03 DIAGNOSIS — S3992XA Unspecified injury of lower back, initial encounter: Secondary | ICD-10-CM | POA: Insufficient documentation

## 2014-08-03 DIAGNOSIS — S4992XA Unspecified injury of left shoulder and upper arm, initial encounter: Secondary | ICD-10-CM | POA: Insufficient documentation

## 2014-08-03 DIAGNOSIS — Y92410 Unspecified street and highway as the place of occurrence of the external cause: Secondary | ICD-10-CM | POA: Insufficient documentation

## 2014-08-03 DIAGNOSIS — S3991XA Unspecified injury of abdomen, initial encounter: Secondary | ICD-10-CM | POA: Insufficient documentation

## 2014-08-03 DIAGNOSIS — S299XXA Unspecified injury of thorax, initial encounter: Secondary | ICD-10-CM | POA: Insufficient documentation

## 2014-08-03 DIAGNOSIS — F419 Anxiety disorder, unspecified: Secondary | ICD-10-CM | POA: Insufficient documentation

## 2014-08-03 DIAGNOSIS — Y9389 Activity, other specified: Secondary | ICD-10-CM | POA: Insufficient documentation

## 2014-08-03 DIAGNOSIS — Z880 Allergy status to penicillin: Secondary | ICD-10-CM | POA: Insufficient documentation

## 2014-08-03 DIAGNOSIS — T148XXA Other injury of unspecified body region, initial encounter: Secondary | ICD-10-CM

## 2014-08-03 DIAGNOSIS — Y998 Other external cause status: Secondary | ICD-10-CM | POA: Insufficient documentation

## 2014-08-03 DIAGNOSIS — S161XXA Strain of muscle, fascia and tendon at neck level, initial encounter: Secondary | ICD-10-CM | POA: Insufficient documentation

## 2014-08-03 DIAGNOSIS — T148 Other injury of unspecified body region: Secondary | ICD-10-CM | POA: Insufficient documentation

## 2014-08-03 LAB — I-STAT CHEM 8, ED
BUN: 15 mg/dL (ref 6–23)
CREATININE: 0.6 mg/dL (ref 0.50–1.10)
Calcium, Ion: 1.15 mmol/L (ref 1.12–1.23)
Chloride: 104 mmol/L (ref 96–112)
Glucose, Bld: 85 mg/dL (ref 70–99)
HEMATOCRIT: 39 % (ref 36.0–46.0)
Hemoglobin: 13.3 g/dL (ref 12.0–15.0)
Potassium: 3.3 mmol/L — ABNORMAL LOW (ref 3.5–5.1)
Sodium: 141 mmol/L (ref 135–145)
TCO2: 22 mmol/L (ref 0–100)

## 2014-08-03 LAB — CBC WITH DIFFERENTIAL/PLATELET
Basophils Absolute: 0 10*3/uL (ref 0.0–0.1)
Basophils Relative: 1 % (ref 0–1)
Eosinophils Absolute: 0.1 10*3/uL (ref 0.0–0.7)
Eosinophils Relative: 2 % (ref 0–5)
HEMATOCRIT: 37.1 % (ref 36.0–46.0)
HEMOGLOBIN: 12.1 g/dL (ref 12.0–15.0)
LYMPHS PCT: 33 % (ref 12–46)
Lymphs Abs: 1.6 10*3/uL (ref 0.7–4.0)
MCH: 28.7 pg (ref 26.0–34.0)
MCHC: 32.6 g/dL (ref 30.0–36.0)
MCV: 88.1 fL (ref 78.0–100.0)
MONO ABS: 0.4 10*3/uL (ref 0.1–1.0)
MONOS PCT: 8 % (ref 3–12)
Neutro Abs: 2.8 10*3/uL (ref 1.7–7.7)
Neutrophils Relative %: 56 % (ref 43–77)
PLATELETS: 263 10*3/uL (ref 150–400)
RBC: 4.21 MIL/uL (ref 3.87–5.11)
RDW: 14 % (ref 11.5–15.5)
WBC: 4.9 10*3/uL (ref 4.0–10.5)

## 2014-08-03 LAB — I-STAT BETA HCG BLOOD, ED (MC, WL, AP ONLY): I-stat hCG, quantitative: <5 m[IU]/mL (ref <5)

## 2014-08-03 MED ORDER — SODIUM CHLORIDE 0.9 % IV BOLUS (SEPSIS)
500.0000 mL | Freq: Once | INTRAVENOUS | Status: AC
  Administered 2014-08-03: 500 mL via INTRAVENOUS

## 2014-08-03 MED ORDER — IOHEXOL 300 MG/ML  SOLN
100.0000 mL | Freq: Once | INTRAMUSCULAR | Status: AC | PRN
  Administered 2014-08-03: 100 mL via INTRAVENOUS

## 2014-08-03 MED ORDER — ONDANSETRON HCL 4 MG/2ML IJ SOLN
4.0000 mg | Freq: Once | INTRAMUSCULAR | Status: AC
  Administered 2014-08-03: 4 mg via INTRAVENOUS
  Filled 2014-08-03: qty 2

## 2014-08-03 MED ORDER — OXYCODONE-ACETAMINOPHEN 5-325 MG PO TABS: 1.0000 | ORAL_TABLET | Freq: Four times a day (QID) | ORAL | Status: DC | PRN

## 2014-08-03 MED ORDER — MORPHINE SULFATE 2 MG/ML IJ SOLN
2.0000 mg | Freq: Once | INTRAMUSCULAR | Status: AC
  Administered 2014-08-03: 2 mg via INTRAVENOUS
  Filled 2014-08-03: qty 1
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
## 2014-08-03 NOTE — ED Notes (Signed)
Patient transported to CT 
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
## 2015-01-12 ENCOUNTER — Ambulatory Visit (INDEPENDENT_AMBULATORY_CARE_PROVIDER_SITE_OTHER): Payer: Self-pay | Admitting: Internal Medicine

## 2015-01-12 VITALS — BP 110/62 | HR 55 | Temp 98.9°F | Resp 16 | Ht 64.0 in | Wt 123.8 lb

## 2015-01-12 DIAGNOSIS — Z7251 High risk heterosexual behavior: Secondary | ICD-10-CM

## 2015-01-12 DIAGNOSIS — R300 Dysuria: Secondary | ICD-10-CM

## 2015-01-12 DIAGNOSIS — N72 Inflammatory disease of cervix uteri: Secondary | ICD-10-CM

## 2015-01-12 DIAGNOSIS — R1032 Left lower quadrant pain: Secondary | ICD-10-CM

## 2015-01-12 DIAGNOSIS — F4323 Adjustment disorder with mixed anxiety and depressed mood: Secondary | ICD-10-CM

## 2015-01-12 DIAGNOSIS — Z8742 Personal history of other diseases of the female genital tract: Secondary | ICD-10-CM

## 2015-01-12 DIAGNOSIS — F410 Panic disorder [episodic paroxysmal anxiety] without agoraphobia: Secondary | ICD-10-CM

## 2015-01-12 LAB — POCT URINE PREGNANCY: Preg Test, Ur: NEGATIVE

## 2015-01-12 LAB — POCT URINALYSIS DIPSTICK
Bilirubin, UA: NEGATIVE
Blood, UA: NEGATIVE
Glucose, UA: NEGATIVE
Ketones, UA: NEGATIVE
Leukocytes, UA: NEGATIVE
NITRITE UA: NEGATIVE
Protein, UA: NEGATIVE
Spec Grav, UA: 1.015
Urobilinogen, UA: 0.2
pH, UA: 6

## 2015-01-12 LAB — POCT UA - MICROSCOPIC ONLY
Casts, Ur, LPF, POC: NEGATIVE
Crystals, Ur, HPF, POC: NEGATIVE
RBC, URINE, MICROSCOPIC: NEGATIVE
Yeast, UA: NEGATIVE

## 2015-01-12 MED ORDER — ALPRAZOLAM 0.25 MG PO TABS
0.2500 mg | ORAL_TABLET | ORAL | Status: AC | PRN
Start: 2015-01-12 — End: ?

## 2015-01-12 MED ORDER — MELOXICAM 15 MG PO TABS: 15.0000 mg | ORAL_TABLET | Freq: Every day | ORAL | Status: AC

## 2015-01-12 MED ORDER — FLUOXETINE HCL 10 MG PO CAPS: 10.0000 mg | ORAL_CAPSULE | Freq: Every day | ORAL | Status: AC

## 2015-01-12 NOTE — Patient Instructions (Signed)
[facility]
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
## 2015-01-15 ENCOUNTER — Telehealth: Payer: Self-pay

## 2015-01-15 NOTE — Telephone Encounter (Signed)
Pt called inquiring about her lab results.  Please note - we had her phone number wrong.  The correct number is [phone]

## 2015-01-16 ENCOUNTER — Ambulatory Visit (INDEPENDENT_AMBULATORY_CARE_PROVIDER_SITE_OTHER): Payer: Self-pay | Admitting: Physician Assistant

## 2015-01-16 ENCOUNTER — Ambulatory Visit: Payer: Self-pay

## 2015-01-16 VITALS — BP 118/70 | HR 73 | Temp 98.0°F | Resp 16 | Ht 64.0 in | Wt 124.0 lb

## 2015-01-16 DIAGNOSIS — R1032 Left lower quadrant pain: Secondary | ICD-10-CM

## 2015-01-16 DIAGNOSIS — N390 Urinary tract infection, site not specified: Secondary | ICD-10-CM

## 2015-01-16 DIAGNOSIS — R319 Hematuria, unspecified: Secondary | ICD-10-CM

## 2015-01-16 LAB — POCT URINALYSIS DIPSTICK
GLUCOSE UA: NEGATIVE
LEUKOCYTES UA: NEGATIVE
Nitrite, UA: NEGATIVE
Protein, UA: 100
Spec Grav, UA: >=1.03
Urobilinogen, UA: 4
pH, UA: 6.5

## 2015-01-16 LAB — POCT UA - MICROSCOPIC ONLY
CASTS, UR, LPF, POC: NEGATIVE
Crystals, Ur, HPF, POC: NEGATIVE
MUCUS UA: POSITIVE
Yeast, UA: NEGATIVE

## 2015-01-16 LAB — PAP IG, CT-NG, RFX HPV ASCU
CHLAMYDIA PROBE AMP: NEGATIVE
GC Probe Amp: NEGATIVE

## 2015-01-16 MED ORDER — NITROFURANTOIN MONOHYD MACRO 100 MG PO CAPS: 100.0000 mg | ORAL_CAPSULE | Freq: Two times a day (BID) | ORAL | Status: DC

## 2015-01-16 MED ORDER — TRAMADOL-ACETAMINOPHEN 37.5-325 MG PO TABS: 1.0000 | ORAL_TABLET | Freq: Four times a day (QID) | ORAL | Status: AC | PRN

## 2015-01-16 MED ORDER — CIPROFLOXACIN HCL 500 MG PO TABS: 500.0000 mg | ORAL_TABLET | Freq: Two times a day (BID) | ORAL | Status: DC

## 2015-01-16 NOTE — Telephone Encounter (Addendum)
Requested call back to report results. Please ask for a reliable time for me to reach her. Thank you!

## 2015-01-16 NOTE — Progress Notes (Signed)
01/16/2015 at 4:09 PM  Carol [name] / DOB: 08/19/1980 / MRN: [id number]  The patient has VAGINAL DISCHARGE; ABNORMAL MATERNAL GLUCOSE TOLERANCE ANTEPARTUM; FEVER UNSPECIFIED; and URINARY URGENCY on her problem list.  SUBJECTIVE  Chief complaint: Hematuria and Abdominal Pain  Carol [name] is a well appearing 34 y.o. female for a continuation of left lower quadrant pain that started about 6 days ago.  She was seen 4 days ago and her work up was negative for a urinary or reproductive cause of her symptoms. She does complain of some hematuria that stated this morning and has had some waxing and waning urgency, frequency, and dysuria over the last six days.  She has tried Meloxicam 15 mg qd without relief of her pain.    She  has a past medical history of Anxiety.    Medications reviewed and updated by myself where necessary, and exist elsewhere in the encounter.   Ms. Carol [name] is allergic to advil and penicillins. She  reports that she has never smoked. She does not have any smokeless tobacco history on file. She reports that she does not drink alcohol or use illicit drugs. She  has no sexual activity history on file. The patient  has no past surgical history on file.  Her family history is not on file.  Review of Systems  Constitutional: Negative for fever and chills.  Respiratory: Negative for cough.   Cardiovascular: Negative for chest pain.  Gastrointestinal: Positive for abdominal pain. Negative for diarrhea and constipation.  Genitourinary: Positive for hematuria.  Skin: Negative for rash.  Neurological: Negative for dizziness.    OBJECTIVE  Her  height is 5\' 4"  (1.626 m) and weight is 124 lb (56.246 kg). Her oral temperature is 98 F (36.7 C). Her blood pressure is 118/70 and her pulse is 73. Her respiration is 16 and oxygen saturation is 98%.  The patient's body mass index is 21.27 kg/(m^2).  Physical Exam  Vitals reviewed. Constitutional: She is  oriented to person, place, and time. She appears well-developed and well-nourished. She appears distressed.  Cardiovascular: Normal rate and regular rhythm.   Respiratory: Effort normal and breath sounds normal.  GI: Soft. Bowel sounds are normal. She exhibits no distension and no mass. There is tenderness in the suprapubic area. There is no rebound, no guarding and no CVA tenderness.  Musculoskeletal: Normal range of motion.  Neurological: She is alert and oriented to person, place, and time. No cranial nerve deficit.  Skin: Skin is warm and dry. She is not diaphoretic.  Psychiatric: She has a normal mood and affect.    Results for orders placed or performed in visit on 01/16/15 (from the past 24 hour(s))  POCT urinalysis dipstick     Status: None   Collection Time: 01/16/15  3:29 PM  Result Value Ref Range   Color, UA orange    Clarity, UA cloudy    Glucose, UA neg    Bilirubin, UA mod    Ketones, UA trace    Spec Grav, UA >=1.030    Blood, UA large    pH, UA 6.5    Protein, UA 100    Urobilinogen, UA 4.0    Nitrite, UA neg    Leukocytes, UA Negative Negative  POCT UA - Microscopic Only     Status: None   Collection Time: 01/16/15  3:29 PM  Result Value Ref Range   WBC, Ur, HPF, POC 15-20+    RBC, urine, microscopic TNTC    Bacteria, U  Microscopic Many    Mucus, UA pos    Epithelial cells, urine per micros TNTC    Crystals, Ur, HPF, POC neg    Casts, Ur, LPF, POC neg    Yeast, UA neg     ASSESSMENT & PLAN  Sheree was seen today for hematuria and abdominal pain.  Diagnoses and all orders for this visit:  LLQ abdominal pain Orders: -     POCT urinalysis dipstick -     POCT UA - Microscopic Only -     Cancel: DG Abd 2 Views; Future -     Cancel: POCT CBC -     traMADol-acetaminophen (ULTRACET) 37.5-325 MG per tablet; Take 1 tablet by mouth every 6 (six) hours as needed.  Hematuria Orders: -     Cancel: POCT CBC -     Urine culture  UTI (lower urinary tract  infection) Orders: -     ciprofloxacin (CIPRO) 500 MG tablet; Take 1 tablet (500 mg total) by mouth 2 (two) times daily.  Other orders -     Discontinue: nitrofurantoin, macrocrystal-monohydrate, (MACROBID) 100 MG capsule; Take 1 capsule (100 mg total) by mouth 2 (two) times daily.    The patient was advised to call or come back to clinic if she does not see an improvement in symptoms, or worsens with the above plan.   Deliah Boston, MHS, PA-C [facility] 01/16/2015 4:09 PM

## 2015-01-16 NOTE — Telephone Encounter (Signed)
Review results

## 2015-01-16 NOTE — Telephone Encounter (Signed)
Reported normal results the patient. She stated that she was supposed to have STI testing but there are no results pending. I recommended that she return to clinic for this. She wanted to have an appointment scheduled as soon as possible, so I suggested she come into the walk-in clinic otherwise she can wait until she gets a call first from scheduling.

## 2015-01-18 LAB — URINE CULTURE

## 2015-01-18 NOTE — Telephone Encounter (Signed)
Provider sent message to Scheduling Center to schedule appt for patient.

## 2015-01-25 ENCOUNTER — Emergency Department (HOSPITAL_COMMUNITY)
Admission: EM | Admit: 2015-01-25 | Discharge: 2015-01-25 | Disposition: A | Discharge status: Home / Self Care | Payer: Self-pay | Attending: Emergency Medicine | Admitting: Emergency Medicine

## 2015-01-25 ENCOUNTER — Emergency Department (HOSPITAL_COMMUNITY): Payer: Self-pay

## 2015-01-25 ENCOUNTER — Encounter (HOSPITAL_COMMUNITY): Payer: Self-pay

## 2015-01-25 DIAGNOSIS — Z3202 Encounter for pregnancy test, result negative: Secondary | ICD-10-CM | POA: Insufficient documentation

## 2015-01-25 DIAGNOSIS — Z791 Long term (current) use of non-steroidal anti-inflammatories (NSAID): Secondary | ICD-10-CM | POA: Insufficient documentation

## 2015-01-25 DIAGNOSIS — Z87440 Personal history of urinary (tract) infections: Secondary | ICD-10-CM | POA: Insufficient documentation

## 2015-01-25 DIAGNOSIS — F419 Anxiety disorder, unspecified: Secondary | ICD-10-CM | POA: Insufficient documentation

## 2015-01-25 DIAGNOSIS — R102 Pelvic and perineal pain: Secondary | ICD-10-CM | POA: Insufficient documentation

## 2015-01-25 DIAGNOSIS — R110 Nausea: Secondary | ICD-10-CM | POA: Insufficient documentation

## 2015-01-25 DIAGNOSIS — Z880 Allergy status to penicillin: Secondary | ICD-10-CM | POA: Insufficient documentation

## 2015-01-25 DIAGNOSIS — R1032 Left lower quadrant pain: Secondary | ICD-10-CM | POA: Insufficient documentation

## 2015-01-25 DIAGNOSIS — Z79899 Other long term (current) drug therapy: Secondary | ICD-10-CM | POA: Insufficient documentation

## 2015-01-25 LAB — COMPREHENSIVE METABOLIC PANEL
ALK PHOS: 48 U/L (ref 38–126)
ALT: 10 U/L — ABNORMAL LOW (ref 14–54)
AST: 17 U/L (ref 15–41)
Albumin: 4.4 g/dL (ref 3.5–5.0)
Anion gap: 10 (ref 5–15)
BUN: 12 mg/dL (ref 6–20)
CHLORIDE: 106 mmol/L (ref 101–111)
CO2: 22 mmol/L (ref 22–32)
Calcium: 9.7 mg/dL (ref 8.9–10.3)
Creatinine, Ser: 0.61 mg/dL (ref 0.44–1.00)
GFR calc Af Amer: >60 mL/min (ref >60)
GFR calc non Af Amer: >60 mL/min (ref >60)
GLUCOSE: 117 mg/dL — AB (ref 65–99)
POTASSIUM: 3.2 mmol/L — AB (ref 3.5–5.1)
SODIUM: 138 mmol/L (ref 135–145)
TOTAL PROTEIN: 7.4 g/dL (ref 6.5–8.1)
Total Bilirubin: 0.8 mg/dL (ref 0.3–1.2)

## 2015-01-25 LAB — I-STAT BETA HCG BLOOD, ED (MC, WL, AP ONLY): I-stat hCG, quantitative: <5 m[IU]/mL (ref <5)

## 2015-01-25 LAB — URINALYSIS, ROUTINE W REFLEX MICROSCOPIC
BILIRUBIN URINE: NEGATIVE
Glucose, UA: NEGATIVE mg/dL
Hgb urine dipstick: NEGATIVE
Ketones, ur: 40 mg/dL — AB
Leukocytes, UA: NEGATIVE
NITRITE: NEGATIVE
PH: 7.5 (ref 5.0–8.0)
Protein, ur: NEGATIVE mg/dL
SPECIFIC GRAVITY, URINE: 1.015 (ref 1.005–1.030)
UROBILINOGEN UA: 0.2 mg/dL (ref 0.0–1.0)

## 2015-01-25 LAB — WET PREP, GENITAL
Clue Cells Wet Prep HPF POC: NONE SEEN
Trich, Wet Prep: NONE SEEN
WBC, Wet Prep HPF POC: NONE SEEN
YEAST WET PREP: NONE SEEN

## 2015-01-25 LAB — LIPASE, BLOOD: LIPASE: 27 U/L (ref 22–51)

## 2015-01-25 LAB — CBC
HCT: 40.4 % (ref 36.0–46.0)
Hemoglobin: 13.8 g/dL (ref 12.0–15.0)
MCH: 29.7 pg (ref 26.0–34.0)
MCHC: 34.2 g/dL (ref 30.0–36.0)
MCV: 87.1 fL (ref 78.0–100.0)
Platelets: 264 10*3/uL (ref 150–400)
RBC: 4.64 MIL/uL (ref 3.87–5.11)
RDW: 13.2 % (ref 11.5–15.5)
WBC: 7.7 10*3/uL (ref 4.0–10.5)

## 2015-01-25 LAB — PREGNANCY, URINE: PREG TEST UR: NEGATIVE

## 2015-01-25 MED ORDER — ONDANSETRON 4 MG PO TBDP
4.0000 mg | ORAL_TABLET | Freq: Once | ORAL | Status: AC | PRN
  Administered 2015-01-25: 4 mg via ORAL

## 2015-01-25 MED ORDER — OXYCODONE-ACETAMINOPHEN 5-325 MG PO TABS
1.0000 | ORAL_TABLET | Freq: Once | ORAL | Status: AC
  Administered 2015-01-25: 1 via ORAL

## 2015-01-25 MED ORDER — CEFTRIAXONE SODIUM 250 MG IJ SOLR
250.0000 mg | INTRAMUSCULAR | Status: DC
  Administered 2015-01-25: 250 mg via INTRAMUSCULAR
  Filled 2015-01-25: qty 250

## 2015-01-25 MED ORDER — NAPROXEN 500 MG PO TABS: 500.0000 mg | ORAL_TABLET | Freq: Two times a day (BID) | ORAL | Status: AC

## 2015-01-25 MED ORDER — ONDANSETRON 4 MG PO TBDP
ORAL_TABLET | ORAL | Status: AC
  Filled 2015-01-25: qty 1

## 2015-01-25 MED ORDER — OXYCODONE-ACETAMINOPHEN 5-325 MG PO TABS
ORAL_TABLET | ORAL | Status: AC
  Filled 2015-01-25: qty 1

## 2015-01-25 MED ORDER — IOHEXOL 300 MG/ML  SOLN
75.0000 mL | Freq: Once | INTRAMUSCULAR | Status: AC | PRN
  Administered 2015-01-25: 100 mL via INTRAVENOUS

## 2015-01-25 MED ORDER — MORPHINE SULFATE 2 MG/ML IJ SOLN
2.0000 mg | Freq: Once | INTRAMUSCULAR | Status: AC
  Administered 2015-01-25: 2 mg via INTRAVENOUS
  Filled 2015-01-25: qty 1

## 2015-01-25 MED ORDER — LIDOCAINE HCL (PF) 1 % IJ SOLN
INTRAMUSCULAR | Status: AC
  Administered 2015-01-25: 5 mL
  Filled 2015-01-25: qty 5

## 2015-01-25 MED ORDER — AZITHROMYCIN 250 MG PO TABS
1000.0000 mg | ORAL_TABLET | Freq: Once | ORAL | Status: AC
  Administered 2015-01-25: 1000 mg via ORAL
  Filled 2015-01-25: qty 4

## 2015-01-25 MED ORDER — IOHEXOL 300 MG/ML  SOLN
25.0000 mL | Freq: Once | INTRAMUSCULAR | Status: AC | PRN
  Administered 2015-01-25: 25 mL via ORAL

## 2015-01-25 NOTE — ED Notes (Signed)
No s/s of reaction noted to Rocephin injection.

## 2015-01-25 NOTE — Discharge Instructions (Signed)
Abdominal Pain, Women °Abdominal (stomach, pelvic, or belly) pain can be caused by many things. It is important to tell your doctor: °· The location of the pain. °· Does it come and go or is it present all the time? °· Are there things that start the pain (eating certain foods, exercise)? °· Are there other symptoms associated with the pain (fever, nausea, vomiting, diarrhea)? °All of this is helpful to know when trying to find the cause of the pain. °CAUSES  °· Stomach: virus or bacteria infection, or ulcer. °· Intestine: appendicitis (inflamed appendix), regional ileitis (Crohn's disease), ulcerative colitis (inflamed colon), irritable bowel syndrome, diverticulitis (inflamed diverticulum of the colon), or cancer of the stomach or intestine. °· Gallbladder disease or stones in the gallbladder. °· Kidney disease, kidney stones, or infection. °· Pancreas infection or cancer. °· Fibromyalgia (pain disorder). °· Diseases of the female organs: °¨ Uterus: fibroid (non-cancerous) tumors or infection. °¨ Fallopian tubes: infection or tubal pregnancy. °¨ Ovary: cysts or tumors. °¨ Pelvic adhesions (scar tissue). °¨ Endometriosis (uterus lining tissue growing in the pelvis and on the pelvic organs). °¨ Pelvic congestion syndrome (female organs filling up with blood just before the menstrual period). °¨ Pain with the menstrual period. °¨ Pain with ovulation (producing an egg). °¨ Pain with an IUD (intrauterine device, birth control) in the uterus. °¨ Cancer of the female organs. °· Functional pain (pain not caused by a disease, may improve without treatment). °· Psychological pain. °· Depression. °DIAGNOSIS  °Your doctor will decide the seriousness of your pain by doing an examination. °· Blood tests. °· X-rays. °· Ultrasound. °· CT scan (computed tomography, special type of X-ray). °· MRI (magnetic resonance imaging). °· Cultures, for infection. °· Barium enema (dye inserted in the large intestine, to better view it with  X-rays). °· Colonoscopy (looking in intestine with a lighted tube). °· Laparoscopy (minor surgery, looking in abdomen with a lighted tube). °· Major abdominal exploratory surgery (looking in abdomen with a large incision). °TREATMENT  °The treatment will depend on the cause of the pain.  °· Many cases can be observed and treated at home. °· Over-the-counter medicines recommended by your caregiver. °· Prescription medicine. °· Antibiotics, for infection. °· Birth control pills, for painful periods or for ovulation pain. °· Hormone treatment, for endometriosis. °· Nerve blocking injections. °· Physical therapy. °· Antidepressants. °· Counseling with a psychologist or psychiatrist. °· Minor or major surgery. °HOME CARE INSTRUCTIONS  °· Do not take laxatives, unless directed by your caregiver. °· Take over-the-counter pain medicine only if ordered by your caregiver. Do not take aspirin because it can cause an upset stomach or bleeding. °· Try a clear liquid diet (broth or water) as ordered by your caregiver. Slowly move to a bland diet, as tolerated, if the pain is related to the stomach or intestine. °· Have a thermometer and take your temperature several times a day, and record it. °· Bed rest and sleep, if it helps the pain. °· Avoid sexual intercourse, if it causes pain. °· Avoid stressful situations. °· Keep your follow-up appointments and tests, as your caregiver orders. °· If the pain does not go away with medicine or surgery, you may try: °¨ Acupuncture. °¨ Relaxation exercises (yoga, meditation). °¨ Group therapy. °¨ Counseling. °SEEK MEDICAL CARE IF:  °· You notice certain foods cause stomach pain. °· Your home care treatment is not helping your pain. °· You need stronger pain medicine. °· You want your IUD removed. °· You feel faint or   lightheaded. °· You develop nausea and vomiting. °· You develop a rash. °· You are having side effects or an allergy to your medicine. °SEEK IMMEDIATE MEDICAL CARE IF:  °· Your  pain does not go away or gets worse. °· You have a fever. °· Your pain is felt only in portions of the abdomen. The right side could possibly be appendicitis. The left lower portion of the abdomen could be colitis or diverticulitis. °· You are passing blood in your stools (bright red or black tarry stools, with or without vomiting). °· You have blood in your urine. °· You develop chills, with or without a fever. °· You pass out. °MAKE SURE YOU:  °· Understand these instructions. °· Will watch your condition. °· Will get help right away if you are not doing well or get worse. °Document Released: 04/13/2007 Document Revised: 10/31/2013 Document Reviewed: 05/03/2009 °[employer]® Patient Information ©2015 [employer]. This information is not intended to replace advice given to you by your health care provider. Make sure you discuss any questions you have with your health care provider. ° °

## 2015-01-25 NOTE — ED Provider Notes (Signed)
CSN: [id number]     Arrival date & time 01/25/15  1322 History   First MD Initiated Contact with Patient 01/25/15 1604     Chief Complaint  Patient presents with  . Abdominal Pain   (Consider location/radiation/quality/duration/timing/severity/associated sxs/prior Treatment) HPI  Patient is a 34 year old Latino female presenting for increasing left lower quadrant abdominal pain. She reports left lower quadrant abdominal pain over the last month. She has been seen at the urgent care twice. She was diagnosed with a UTI previously with no resolution with antibiotics. She denies any vaginal bleeding, discharge, history of STI at this time. Reports pain now radiating across her abdomen and into her lower back. Mild alleviation with pain killers previously prescribed to her. Aggravated by movement and palpation.  No n/v, diarrhea, or constipation.   Past Medical History  Diagnosis Date  . Anxiety    History reviewed. No pertinent past surgical history. History reviewed. No pertinent family history. History  Substance Use Topics  . Smoking status: Never Smoker   . Smokeless tobacco: Not on file  . Alcohol Use: No   OB History    No data available     Review of Systems  Constitutional: Negative for fever and chills.  HENT: Negative for congestion and sore throat.   Eyes: Negative for pain.  Respiratory: Negative for cough and shortness of breath.   Cardiovascular: Negative for chest pain and palpitations.  Gastrointestinal: Positive for nausea and abdominal pain. Negative for vomiting and diarrhea.  Genitourinary: Positive for pelvic pain. Negative for dysuria, flank pain, vaginal bleeding, vaginal discharge and vaginal pain.  Musculoskeletal: Negative for back pain and neck pain.  Skin: Negative for rash.  Allergic/Immunologic: Negative.   Neurological: Negative for dizziness and light-headedness.  Psychiatric/Behavioral: Negative for confusion.      Allergies  Advil and  Penicillins  Home Medications   Prior to Admission medications   Medication Sig Start Date End Date Taking? Authorizing Provider  ALPRAZolam (XANAX) 0.25 MG tablet Take 1 tablet (0.25 mg total) by mouth as needed for anxiety. For panic attack. No more than 1 per 24 hours 01/12/15  Yes Tonye Pearson, MD  aspirin EC 325 MG tablet Take 650 mg by mouth 3 (three) times daily as needed (pain).   Yes Historical Provider, MD  FLUoxetine (PROZAC) 10 MG capsule Take 1 capsule (10 mg total) by mouth daily. 01/12/15  Yes Tonye Pearson, MD  meloxicam (MOBIC) 15 MG tablet Take 1 tablet (15 mg total) by mouth daily. As needed for pain.Discontinue if causes dizziness 01/12/15  Yes Tonye Pearson, MD  traMADol-acetaminophen (ULTRACET) 37.5-325 MG per tablet Take 1 tablet by mouth every 6 (six) hours as needed. 01/16/15  Yes Ofilia Neas, PA-C  LORazepam (ATIVAN) 1 MG tablet Take 1 tablet (1 mg total) by mouth every 8 (eight) hours as needed for anxiety. Patient not taking: Reported on 01/25/2015 11/27/13   Zadie Rhine, MD  naproxen (NAPROSYN) 500 MG tablet Take 1 tablet (500 mg total) by mouth 2 (two) times daily. 01/25/15   Glynn Octave, MD   BP 125/69 mmHg  Pulse 61  Temp(Src) 98.6 F (37 C) (Oral)  Resp 16  Ht 5\' 4"  (1.626 m)  Wt 123 lb (55.792 kg)  BMI 21.10 kg/m2  SpO2 100%  LMP 12/26/2014 Physical Exam  Constitutional: She is oriented to person, place, and time. She appears well-developed and well-nourished. She appears distressed.  HENT:  Head: Normocephalic and atraumatic.  Eyes: Conjunctivae and EOM  are normal. Pupils are equal, round, and reactive to light.  Neck: Normal range of motion. Neck supple.  Cardiovascular: Normal rate, regular rhythm and normal heart sounds.   Pulmonary/Chest: Effort normal and breath sounds normal. No respiratory distress.  Abdominal: Soft. Bowel sounds are normal. There is tenderness in the left lower quadrant. There is guarding. There is no  rigidity, no rebound, no CVA tenderness, no tenderness at McBurney's point and negative Murphy's sign.  Genitourinary: Vagina normal and uterus normal. There is no rash or tenderness on the right labia. There is no rash or tenderness on the left labia. Cervix exhibits discharge (white milky). Cervix exhibits no motion tenderness and no friability. Right adnexum displays no mass, no tenderness and no fullness. Left adnexum displays tenderness. Left adnexum displays no mass and no fullness.  Musculoskeletal: Normal range of motion.  Neurological: She is alert and oriented to person, place, and time. She has normal reflexes. No cranial nerve deficit.  Skin: Skin is warm and dry. She is not diaphoretic.  Psychiatric: She has a normal mood and affect.    ED Course  Procedures (including critical care time) Labs Review Labs Reviewed  COMPREHENSIVE METABOLIC PANEL - Abnormal; Notable for the following:    Potassium 3.2 (*)    Glucose, Bld 117 (*)    ALT 10 (*)    All other components within normal limits  URINALYSIS, ROUTINE W REFLEX MICROSCOPIC (NOT AT [facility]) - Abnormal; Notable for the following:    Ketones, ur 40 (*)    All other components within normal limits  WET PREP, GENITAL  LIPASE, BLOOD  CBC  PREGNANCY, URINE  I-STAT BETA HCG BLOOD, ED ([facility], [facility], [facility] ONLY)  GC/CHLAMYDIA PROBE AMP ([place]) NOT AT [facility]   Imaging Review US Transvaginal Non-ob  01/25/2015   CLINICAL DATA:  Left lower quadrant pain for 3 weeks  EXAM: TRANSABDOMINAL AND TRANSVAGINAL ULTRASOUND OF PELVIS  TECHNIQUE: Both transabdominal and transvaginal ultrasound examinations of the pelvis were performed. Transabdominal technique was performed for global imaging of the pelvis including uterus, ovaries, adnexal regions, and pelvic cul-de-sac. It was necessary to proceed with endovaginal exam following the transabdominal exam to visualize the ovaries.  COMPARISON:  None  FINDINGS: Uterus  Measurements: 10.3 x 4.8 x 6.7 cm.  No fibroids or other mass visualized.  Endometrium  Thickness: 6 mm.  No focal abnormality visualized.  Right ovary  Measurements: 3.0 x 2.8 x 3.0 cm. There is a 2 cm simple unilocular cyst or follicle. No solid or complex lesions.  Left ovary  Measurements: 2.5 x 1.8 x 1.9 cm. Normal appearance/no adnexal mass.  Other findings  No free fluid.  IMPRESSION: No significant abnormality.   Electronically Signed   By: Ellery Plunk M.D.   On: 01/25/2015 18:14   US Pelvis Complete  01/25/2015   CLINICAL DATA:  Left lower quadrant pain for 3 weeks  EXAM: TRANSABDOMINAL AND TRANSVAGINAL ULTRASOUND OF PELVIS  TECHNIQUE: Both transabdominal and transvaginal ultrasound examinations of the pelvis were performed. Transabdominal technique was performed for global imaging of the pelvis including uterus, ovaries, adnexal regions, and pelvic cul-de-sac. It was necessary to proceed with endovaginal exam following the transabdominal exam to visualize the ovaries.  COMPARISON:  None  FINDINGS: Uterus  Measurements: 10.3 x 4.8 x 6.7 cm. No fibroids or other mass visualized.  Endometrium  Thickness: 6 mm.  No focal abnormality visualized.  Right ovary  Measurements: 3.0 x 2.8 x 3.0 cm. There is a 2 cm  simple unilocular cyst or follicle. No solid or complex lesions.  Left ovary  Measurements: 2.5 x 1.8 x 1.9 cm. Normal appearance/no adnexal mass.  Other findings  No free fluid.  IMPRESSION: No significant abnormality.   Electronically Signed   By: Ellery Plunk M.D.   On: 01/25/2015 18:14   Ct Abdomen Pelvis W Contrast  01/25/2015   CLINICAL DATA:  Acute left lower quadrant pain radiating into the bladder.  EXAM: CT ABDOMEN AND PELVIS WITH CONTRAST  TECHNIQUE: Multidetector CT imaging of the abdomen and pelvis was performed using the standard protocol following bolus administration of intravenous contrast.  CONTRAST:  OMNIPAQUE IOHEXOL 300 MG/ML  SOLN  COMPARISON:  01/25/2015, 08/03/2014  FINDINGS: Lower chest: Minimal  dependent basilar atelectasis bilaterally. No lower lobe pneumonia, collapse or consolidation. Normal heart size. No pericardial or pleural effusion.  Abdomen: Subcentimeter small hepatic dome hypodensities, suspect small hepatic cysts but too small to definitively characterize. No other hepatic abnormality. No biliary dilatation. Patent hepatic and portal veins. Gallbladder, biliary system, pancreas, spleen, adrenal glands, and kidneys are within normal limits for age and demonstrate no acute process.  Negative for bowel obstruction hand or free air.  No abdominal free fluid, fluid collection, hemorrhage, abscess, or adenopathy.  Intact aorta.  No aneurysm or dissection.  Normal appendix in the right lower quadrant.  Pelvis: Trace pelvic free fluid, likely physiologic. Uterus and adnexal normal in size. Small right ovarian dominant follicle measures 18 mm. This correlates with the pelvic ultrasound from earlier today. Urinary bladder unremarkable. Rectum is air distended. No acute distal bowel process.  No pelvic fluid collection, hemorrhage, abscess, adenopathy, inguinal abnormality, or hernia.  No acute or abnormal osseous finding.  IMPRESSION: No acute intra-abdominal or pelvic finding by CT.   Electronically Signed   By: Judie Petit.  Shick M.D.   On: 01/25/2015 22:56     EKG Interpretation None      MDM   Final diagnoses:  Left lower quadrant pain   Patient is a 34 year old Latino female presenting for increasing left lower quadrant abdominal pain.   Ddx UTI, nephrolithiasis, pyleo, torsion, ovarian cyst, PID, TOA, diverticulitis, ectopic pregnancy, colitis.   On initial evaluation pt HDS in moderate distress.  Abdomen with TTP in LLQ but no peritoneal signs.  GU found mild left adnexal tenderness and a clear to milky white discharge.    UA with no signs of blood or infection.  Wet prep negative.  Pregnancy test negative.  CT abdomen pelvis with no indication for abdominal pain.  US pelvis also with  no findings to indicate etiology of patients ongoing pain.  Due to clear discharge GC/CL sent. Treated for possible STI. Unknown etiology of the pain at this time.  Gave pt contact information for OBGYN and advised to f/u as soon as possible.  Also given red flag return precautions which pt understood.   If performed, labs, EKGs, and imaging were reviewed/interpreted by myself and my attending and incorporated into medical decision making.  Discussed pertinent finding with patient or caregiver prior to discharge with no further questions.  Immediate return precautions given and pt or caregiver reports understanding.  Pt care supervised by my attending Dr. Manus Gunning.   Tery Sanfilippo, MD PGY-2  Emergency Medicine     Tery Sanfilippo, MD 01/26/15 1501  Glynn Octave, MD 01/27/15 [phone]

## 2015-01-25 NOTE — ED Notes (Signed)
Pt presents with 3 week h/o L lower abdomen.  Pt reports pain has been constant and had not radiated until today with pain radiating to R side and to lower back.  Pt reports pain to L leg x 3 weeks (denies injury).  Pt seen at urgent care x 2, given medication which has not helped.  +nausea; +intermittent dysuria; pt given abx when she returned to UC due to vaginal bleeding (bled for 5 days) but denies any other vaginal discharge.  Pt describes pain like childbirth.

## 2015-01-25 NOTE — ED Notes (Signed)
Dr Lendon Collar aware of c/o pain.  Waiting for orders.

## 2015-01-25 NOTE — ED Notes (Signed)
In CT at this time

## 2015-01-26 LAB — GC/CHLAMYDIA PROBE AMP (~~LOC~~) NOT AT ARMC
Chlamydia: NEGATIVE
Neisseria Gonorrhea: NEGATIVE
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
## 2022-02-25 ENCOUNTER — Emergency Department (HOSPITAL_COMMUNITY)
Admission: EM | Admit: 2022-02-25 | Discharge: 2022-02-26 | Disposition: A | Discharge status: Home / Self Care | Payer: Self-pay | Attending: Emergency Medicine | Admitting: Emergency Medicine

## 2022-02-25 ENCOUNTER — Other Ambulatory Visit: Payer: Self-pay

## 2022-02-25 ENCOUNTER — Emergency Department (HOSPITAL_COMMUNITY): Payer: Self-pay

## 2022-02-25 ENCOUNTER — Encounter (HOSPITAL_COMMUNITY): Payer: Self-pay

## 2022-02-25 DIAGNOSIS — K529 Noninfective gastroenteritis and colitis, unspecified: Secondary | ICD-10-CM | POA: Insufficient documentation

## 2022-02-25 DIAGNOSIS — Z7982 Long term (current) use of aspirin: Secondary | ICD-10-CM | POA: Insufficient documentation

## 2022-02-25 DIAGNOSIS — R197 Diarrhea, unspecified: Secondary | ICD-10-CM

## 2022-02-25 DIAGNOSIS — N9489 Other specified conditions associated with female genital organs and menstrual cycle: Secondary | ICD-10-CM | POA: Insufficient documentation

## 2022-02-25 DIAGNOSIS — E876 Hypokalemia: Secondary | ICD-10-CM | POA: Insufficient documentation

## 2022-02-25 LAB — URINALYSIS, ROUTINE W REFLEX MICROSCOPIC
Bilirubin Urine: NEGATIVE
Glucose, UA: NEGATIVE mg/dL
Hgb urine dipstick: NEGATIVE
Ketones, ur: NEGATIVE mg/dL
Nitrite: NEGATIVE
Protein, ur: NEGATIVE mg/dL
Specific Gravity, Urine: 1.024 (ref 1.005–1.030)
pH: 7 (ref 5.0–8.0)

## 2022-02-25 LAB — COMPREHENSIVE METABOLIC PANEL
ALT: 22 U/L (ref 0–44)
AST: 18 U/L (ref 15–41)
Albumin: 4.2 g/dL (ref 3.5–5.0)
Alkaline Phosphatase: 48 U/L (ref 38–126)
Anion gap: 8 (ref 5–15)
BUN: 19 mg/dL (ref 6–20)
CO2: 24 mmol/L (ref 22–32)
Calcium: 9.5 mg/dL (ref 8.9–10.3)
Chloride: 108 mmol/L (ref 98–111)
Creatinine, Ser: 0.63 mg/dL (ref 0.44–1.00)
GFR, Estimated: >60 mL/min (ref >60)
Glucose, Bld: 86 mg/dL (ref 70–99)
Potassium: 3.1 mmol/L — ABNORMAL LOW (ref 3.5–5.1)
Sodium: 140 mmol/L (ref 135–145)
Total Bilirubin: 0.6 mg/dL (ref 0.3–1.2)
Total Protein: 7.4 g/dL (ref 6.5–8.1)

## 2022-02-25 LAB — CBC
HCT: 40.2 % (ref 36.0–46.0)
Hemoglobin: 13.6 g/dL (ref 12.0–15.0)
MCH: 30.2 pg (ref 26.0–34.0)
MCHC: 33.8 g/dL (ref 30.0–36.0)
MCV: 89.3 fL (ref 80.0–100.0)
Platelets: 312 10*3/uL (ref 150–400)
RBC: 4.5 MIL/uL (ref 3.87–5.11)
RDW: 13.2 % (ref 11.5–15.5)
WBC: 10.2 10*3/uL (ref 4.0–10.5)
nRBC: 0 % (ref 0.0–0.2)

## 2022-02-25 LAB — LIPASE, BLOOD: Lipase: 34 U/L (ref 11–51)

## 2022-02-25 LAB — I-STAT BETA HCG BLOOD, ED (MC, WL, AP ONLY): I-stat hCG, quantitative: <5 m[IU]/mL (ref <5)

## 2022-02-25 MED ORDER — IOHEXOL 300 MG/ML  SOLN
80.0000 mL | Freq: Once | INTRAMUSCULAR | Status: AC | PRN
  Administered 2022-02-25: 80 mL via INTRAVENOUS

## 2022-02-25 NOTE — ED Triage Notes (Signed)
Reports lower abd pain that started 3 days ago associated with n/v.d

## 2022-02-25 NOTE — ED Provider Triage Note (Signed)
Emergency Medicine Provider Triage Evaluation Note  Carol [name] , a 41 y.o. female  was evaluated in triage.  Pt complains of right-sided abdominal pain for the past 3 days.  Worse with ambulation.  Also endorses some diarrhea.  Nausea but no vomiting.  No history of abdominal surgery.  Review of Systems  Positive: Abdominal pain and diarrhea and nausea Negative: Vomiting and fevers  Physical Exam  BP 124/72 (BP Location: Right Arm)   Pulse 70   Temp 98.7 F (37.1 C) (Oral)   Resp 14   SpO2 99%  Gen:   Awake, no distress   Resp:  Normal effort  MSK:   Moves extremities without difficulty  Other:  Suprapubic and right lower quadrant abdominal pain.  Medical Decision Making  Medically screening exam initiated at 6:20 PM.  Appropriate orders placed.  Felipa Calixto-Salas was informed that the remainder of the evaluation will be completed by another provider, this initial triage assessment does not replace that evaluation, and the importance of remaining in the ED until their evaluation is complete.     Saddie Benders, PA-C 02/25/22 1829

## 2022-02-26 MED ORDER — DICYCLOMINE HCL 10 MG PO CAPS
10.0000 mg | ORAL_CAPSULE | Freq: Once | ORAL | Status: AC
  Administered 2022-02-26: 10 mg via ORAL
  Filled 2022-02-26: qty 1

## 2022-02-26 MED ORDER — ONDANSETRON 4 MG PO TBDP: 4.0000 mg | ORAL_TABLET | Freq: Three times a day (TID) | ORAL | 0 refills | Status: AC | PRN

## 2022-02-26 MED ORDER — ONDANSETRON 4 MG PO TBDP
4.0000 mg | ORAL_TABLET | Freq: Once | ORAL | Status: AC
  Administered 2022-02-26: 4 mg via ORAL
  Filled 2022-02-26: qty 1

## 2022-02-26 MED ORDER — DICYCLOMINE HCL 20 MG PO TABS: 20.0000 mg | ORAL_TABLET | Freq: Two times a day (BID) | ORAL | 0 refills | Status: AC | PRN

## 2022-02-26 NOTE — ED Provider Notes (Signed)
[facility] EMERGENCY DEPARTMENT Provider Note   CSN: [id number] Arrival date & time: 02/25/22  1758     History  Chief Complaint  Patient presents with   Abdominal Pain    Sylvia Helms is a 41 y.o. female with 3 days of generalized lower abdominal pain and associated nausea and greater than 5 episodes of watery diarrhea today.  Subjective fevers at home but no vomiting.  No history of the same no history of intra-abdominal surgeries.  LMP 1 week ago.  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  No personal Her medical records.  She is not on medications daily. she has not been out of the country recently, no known sick contacts.   HPI     Home Medications Prior to Admission medications   Medication Sig Start Date End Date Taking? Authorizing Provider  dicyclomine (BENTYL) 20 MG tablet Take 1 tablet (20 mg total) by mouth 2 (two) times daily as needed for spasms. 02/26/22  Yes Amena Dockham R, PA-C  ondansetron (ZOFRAN-ODT) 4 MG disintegrating tablet Take 1 tablet (4 mg total) by mouth every 8 (eight) hours as needed for nausea or vomiting. 02/26/22  Yes Helmuth Recupero R, PA-C  ALPRAZolam (XANAX) 0.25 MG tablet Take 1 tablet (0.25 mg total) by mouth as needed for anxiety. For panic attack. No more than 1 per 24 hours 01/12/15   Tonye Pearson, MD  aspirin EC 325 MG tablet Take 650 mg by mouth 3 (three) times daily as needed (pain).    [provider]  FLUoxetine (PROZAC) 10 MG capsule Take 1 capsule (10 mg total) by mouth daily. 01/12/15   Tonye Pearson, MD  LORazepam (ATIVAN) 1 MG tablet Take 1 tablet (1 mg total) by mouth every 8 (eight) hours as needed for anxiety. Patient not taking: Reported on 01/25/2015 11/27/13   Zadie Rhine, MD  meloxicam (MOBIC) 15 MG tablet Take 1 tablet (15 mg total) by mouth daily. As needed for pain.Discontinue if causes dizziness 01/12/15    Tonye Pearson, MD  naproxen (NAPROSYN) 500 MG tablet Take 1 tablet (500 mg total) by mouth 2 (two) times daily. 01/25/15   Rancour, Jeannett Senior, MD  traMADol-acetaminophen (ULTRACET) 37.5-325 MG per tablet Take 1 tablet by mouth every 6 (six) hours as needed. 01/16/15   Ofilia Neas, PA-C      Allergies    Advil [ibuprofen] and Penicillins    Review of Systems   Review of Systems  Constitutional:  Positive for appetite change and fever. Negative for fatigue.  HENT: Negative.    Gastrointestinal:  Positive for abdominal distention, diarrhea and nausea. Negative for blood in stool and vomiting.  Genitourinary: Negative.  Negative for decreased urine volume and urgency.    Physical Exam Updated Vital Signs BP 98/61 (BP Location: Left Arm)   Pulse 66   Temp 98.1 F (36.7 C) (Oral)   Resp 16   Ht 5\' 4"  (1.626 m)   Wt 55.8 kg   SpO2 98%   BMI 21.11 kg/m  Physical Exam Vitals and nursing note reviewed.  Constitutional:      Appearance: She is not ill-appearing or toxic-appearing.  HENT:     Head: Normocephalic and atraumatic.     Mouth/Throat:     Mouth: Mucous membranes are moist.     Pharynx: No oropharyngeal exudate or posterior oropharyngeal erythema.  Eyes:     General:  Right eye: No discharge.        Left eye: No discharge.     Conjunctiva/sclera: Conjunctivae normal.  Cardiovascular:     Rate and Rhythm: Normal rate and regular rhythm.     Pulses: Normal pulses.     Heart sounds: Normal heart sounds. No murmur heard. Pulmonary:     Effort: Pulmonary effort is normal. No respiratory distress.     Breath sounds: Normal breath sounds. No wheezing or rales.  Abdominal:     General: Bowel sounds are increased. There is no distension.     Palpations: Abdomen is soft.     Tenderness: There is generalized abdominal tenderness. There is no right CVA tenderness, left CVA tenderness, guarding or rebound. Negative signs include Murphy's sign and McBurney's sign.   Musculoskeletal:        General: No deformity.     Cervical back: Neck supple.  Skin:    General: Skin is warm and dry.     Capillary Refill: Capillary refill takes less than 2 seconds.  Neurological:     General: No focal deficit present.     Mental Status: She is alert. Mental status is at baseline.  Psychiatric:        Mood and Affect: Mood normal.     ED Results / Procedures / Treatments   Labs (all labs ordered are listed, but only abnormal results are displayed) Labs Reviewed  COMPREHENSIVE METABOLIC PANEL - Abnormal; Notable for the following components:      Result Value   Potassium 3.1 (*)    All other components within normal limits  URINALYSIS, ROUTINE W REFLEX MICROSCOPIC - Abnormal; Notable for the following components:   APPearance CLOUDY (*)    Leukocytes,Ua SMALL (*)    Bacteria, UA MANY (*)    All other components within normal limits  LIPASE, BLOOD  CBC  I-STAT BETA HCG BLOOD, ED ([facility], [facility], [facility] ONLY)    EKG None  Radiology CT ABDOMEN PELVIS W CONTRAST  Result Date: 02/25/2022 CLINICAL DATA:  Acute abdominal pain, right-sided abdominal pain. EXAM: CT ABDOMEN AND PELVIS WITH CONTRAST TECHNIQUE: Multidetector CT imaging of the abdomen and pelvis was performed using the standard protocol following bolus administration of intravenous contrast. RADIATION DOSE REDUCTION: This exam was performed according to the departmental dose-optimization program which includes automated exposure control, adjustment of the mA and/or kV according to patient size and/or use of iterative reconstruction technique. CONTRAST:  27mL OMNIPAQUE IOHEXOL 300 MG/ML  SOLN COMPARISON:  CT abdomen and pelvis 01/25/2015. FINDINGS: Lower chest: No acute abnormality. Hepatobiliary: There is fatty infiltration of the liver. Gallbladder and bile ducts are within normal limits. Pancreas: Unremarkable. No pancreatic ductal dilatation or surrounding inflammatory changes. Spleen: Normal in size without  focal abnormality. Adrenals/Urinary Tract: Adrenal glands are unremarkable. Kidneys are normal, without renal calculi, focal lesion, or hydronephrosis. Bladder is unremarkable. Stomach/Bowel: Stomach is within normal limits. Appendix appears normal. No evidence of bowel wall thickening, distention, or inflammatory changes. Vascular/Lymphatic: No significant vascular findings are present. No enlarged abdominal or pelvic lymph nodes. Reproductive: Uterus is slightly lobulated likely related to fibroid change. Adnexa are within normal limits. Other: Small amount of free fluid in the pelvis. No focal abdominal wall hernia. Musculoskeletal: No acute or significant osseous findings. IMPRESSION: 1. Small amount of free fluid in the pelvis is likely physiologic. 2. No other acute localizing process identified in the abdomen or pelvis. 3. Fatty infiltration of the liver. 4. Fibroid uterus. Electronically Signed  By: Darliss Cheney M.D.   On: 02/25/2022 21:42    Procedures Procedures    Medications Ordered in ED Medications  dicyclomine (BENTYL) capsule 10 mg (has no administration in time range)  ondansetron (ZOFRAN-ODT) disintegrating tablet 4 mg (has no administration in time range)  iohexol (OMNIPAQUE) 300 MG/ML solution 80 mL (80 mLs Intravenous Contrast Given 02/25/22 2138)    ED Course/ Medical Decision Making/ A&P                           Medical Decision Making 41 year old female with 3 days of generalized abdominal pain, nausea and diarrhea.  VS are normal on intake.  Cardiopulmonary exam is normal, abdominal exam with generalized mild tenderness to palpation without focality, rebound or guarding.  The differential diagnosis of diarrhea includes but is not limited to: Viral: norovirus/rotavirus; Bacterial-Campylobacter,Shigella, Salmonella, Escherichia coli, E. coli 0157:H7, Yersinia enterocolitica, Vibrio cholerae, Clostridium difficile. Parasitic- Giardia lamblia, Cryptosporidium,Entamoeba  histolytica,Cyclospora, Microsporidium. Toxin- Staphylococcus aureus, Bacillus cereus.  Noninfectious causes include GI Bleed, Appendicitis, Mesenteric Ischemia, Diverticulitis, Adrenal Crisis, Thyroid Storm, Toxicologic exposures, Antibiotic or drug-associated, inflammatory bowel disease.   Amount and/or Complexity of Data Reviewed Labs:     Details: CBC without leukocytosis or anemia.  CMP with mild hypokalemia 3.1, electrolytes otherwise normal with normal renal and hepatic function.  Lipase is normal.  Patient not pregnant and UA is not convincing for infection in context of lack of urinary symptoms.   Radiology:     Details: CT of the abdomen pelvis ordered from triage negative for acute intra-abdominal pelvic abnormality aside from small amount of likely physiologic pelvic fluid.   Risk Prescription drug management.   Overall work-up is very reassuring as is physical exam.  Suspect acute viral etiology of her symptoms.  Will offer Bentyl and Zofran in the emergency department.  Patient tolerating p.o.  Will discharge home with prescriptions for these medications as well.  No further work-up warranted in the ER at this time.  Datra and her partner  voiced understanding of her medical evaluation and treatment plan. Each of their questions answered to their expressed satisfaction.  Return precautions were given.  Patient is well-appearing, stable, and was discharged in good condition.  This chart was dictated using voice recognition software, Dragon. Despite the best efforts of this provider to proofread and correct errors, errors may still occur which can change documentation meaning.   Final Clinical Impression(s) / ED Diagnoses Final diagnoses:  Diarrhea of presumed infectious origin  Gastroenteritis    Rx / DC Orders ED Discharge Orders          Ordered    dicyclomine (BENTYL) 20 MG tablet  2 times daily PRN        02/26/22 0150    ondansetron (ZOFRAN-ODT) 4 MG  disintegrating tablet  Every 8 hours PRN        02/26/22 0153              Jorah Hua, Eugene Gavia, PA-C 02/26/22 0207    Zadie Rhine, MD 02/26/22 [phone]

## 2022-02-26 NOTE — ED Notes (Signed)
RN reviewed discharge instructions with pt. Pt verbalized understanding and had no further questions. VSS upon discharge.  

## 2022-02-26 NOTE — Discharge Instructions (Signed)
You were seen here today for your abdominal pain, nausea and diarrhea.  Your blood work and CT scan were very reassuring.  You likely have a viral illness causing your symptoms.  You have been prescribed medication to help with the nausea as well as the belly spasms at home.  You may take these as prescribed as needed.  Follow-up with a primary care doctor and return to the ER with any new severe symptoms.
